# Patient Record
Sex: Male | Born: 1995 | Race: Black or African American | Hispanic: No | Marital: Single | Smoking: Current every day smoker
Health system: Southern US, Community
[De-identification: ages and names within clinical notes are randomized; demographics above are authoritative.]

## PROBLEM LIST (undated history)

## (undated) ENCOUNTER — Emergency Department (HOSPITAL_BASED_OUTPATIENT_CLINIC_OR_DEPARTMENT_OTHER)
Admission: EM | Payer: BC Managed Care – PPO | Source: Home / Self Care | Attending: Emergency Medicine | Admitting: Emergency Medicine

## (undated) DIAGNOSIS — F1210 Cannabis abuse, uncomplicated: Secondary | ICD-10-CM

## (undated) DIAGNOSIS — G2401 Drug induced subacute dyskinesia: Secondary | ICD-10-CM

## (undated) DIAGNOSIS — F909 Attention-deficit hyperactivity disorder, unspecified type: Secondary | ICD-10-CM

## (undated) DIAGNOSIS — M419 Scoliosis, unspecified: Secondary | ICD-10-CM

## (undated) DIAGNOSIS — R1116 Cannabis hyperemesis syndrome: Secondary | ICD-10-CM

## (undated) DIAGNOSIS — T7840XA Allergy, unspecified, initial encounter: Secondary | ICD-10-CM

## (undated) DIAGNOSIS — R112 Nausea with vomiting, unspecified: Secondary | ICD-10-CM

## (undated) DIAGNOSIS — I861 Scrotal varices: Secondary | ICD-10-CM

## (undated) DIAGNOSIS — L80 Vitiligo: Secondary | ICD-10-CM

## (undated) HISTORY — PX: NO PAST SURGERIES: SHX2092

## (undated) HISTORY — DX: Scrotal varices: I86.1

## (undated) HISTORY — DX: Scoliosis, unspecified: M41.9

## (undated) HISTORY — DX: Vitiligo: L80

## (undated) HISTORY — DX: Allergy, unspecified, initial encounter: T78.40XA

## (undated) HISTORY — DX: Attention-deficit hyperactivity disorder, unspecified type: F90.9

---
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
## 2017-04-17 ENCOUNTER — Ambulatory Visit: Payer: Self-pay | Admitting: Family Medicine

## 2017-04-18 ENCOUNTER — Encounter: Payer: Self-pay | Admitting: Family Medicine

## 2017-04-18 ENCOUNTER — Ambulatory Visit (INDEPENDENT_AMBULATORY_CARE_PROVIDER_SITE_OTHER): Payer: BC Managed Care – PPO | Admitting: Family Medicine

## 2017-04-18 VITALS — BP 128/60 | HR 83 | Temp 97.7°F | Resp 16 | Ht 68.0 in | Wt 141.0 lb

## 2017-04-18 DIAGNOSIS — F329 Major depressive disorder, single episode, unspecified: Secondary | ICD-10-CM | POA: Diagnosis not present

## 2017-04-18 DIAGNOSIS — F419 Anxiety disorder, unspecified: Secondary | ICD-10-CM | POA: Diagnosis not present

## 2017-04-18 DIAGNOSIS — R454 Irritability and anger: Secondary | ICD-10-CM

## 2017-04-18 MED ORDER — FLUOXETINE HCL 10 MG PO CAPS: 10.0000 mg | ORAL_CAPSULE | Freq: Every day | ORAL | 3 refills | Status: DC

## 2017-04-18 NOTE — Progress Notes (Signed)
Patient ID: Jonathan Davidson, male   DOB: 10/27/1995, 21 y.o.   MRN: [id number]     Subjective:  I acted as a Education administrator for Dr. Carollee Herter.  Guerry Bruin, [place]   Patient ID: Jonathan Davidson, male    DOB: 1996/04/27, 21 y.o.   MRN: [id number]  Chief Complaint  Patient presents with  . Anxiety    HPI  Patient is in today for anxiety.  He states that he thinks so much.  He denies being suicidal but states he does think about punching his boss.  He quit his job at [employer]  Patient Care Team: Carollee Herter, Alferd Apa, DO as PCP - General (Family Medicine)   Past Medical History:  Diagnosis Date  . Allergy   . Attention deficit hyperactivity disorder (ADHD) 06/14/2016  . Scoliosis   . Vitiligo 2012    Past Surgical History:  Procedure Laterality Date  . NO PAST SURGERIES      Family History  Problem Relation Age of Onset  . Hypertension Father   . Diabetes Unknown        GM  . Hypertension Unknown        F  . Diabetes Paternal Grandmother   . Coronary artery disease Neg Hx   . Cancer Neg Hx     Social History   Social History  . Marital status: Single    Spouse name: N/A  . Number of children: N/A  . Years of education: N/A   Occupational History  . Not on file.   Social History Main Topics  . Smoking status: Never Smoker  . Smokeless tobacco: Never Used  . Alcohol use No  . Drug use: No  . Sexual activity: Not on file   Other Topics Concern  . Not on file   Social History Narrative   Household: parents, 2 brothers and 1 sister (away in College)---Attends middle school, practice track &field, dance--    Outpatient Medications Prior to Visit  Medication Sig Dispense Refill  . VENTOLIN HFA 108 (90 Base) MCG/ACT inhaler INHALE 2 PUFFS EVERY 6 HRS AS NEEDED FOR WHEEZING/SHORTNESS OF BREATH. USE 2PUFFS 30MINS PRIOR EXERC 18 Inhaler 0  . lisdexamfetamine (VYVANSE) 20 MG capsule Take 1 capsule (20 mg total) by mouth daily. 30 capsule 0   No facility-administered medications prior to  visit.     No Known Allergies  Review of Systems  Constitutional: Negative for fever and malaise/fatigue.  HENT: Negative for congestion.   Eyes: Negative for blurred vision.  Respiratory: Negative for cough and shortness of breath.   Cardiovascular: Negative for chest pain, palpitations and leg swelling.  Gastrointestinal: Negative for vomiting.  Musculoskeletal: Negative for back pain.  Skin: Negative for rash.  Neurological: Negative for loss of consciousness and headaches.       Objective:    Physical Exam  Constitutional: He is oriented to person, place, and time. Vital signs are normal. He appears well-developed and well-nourished. He is sleeping.  HENT:  Head: Normocephalic and atraumatic.  Mouth/Throat: Oropharynx is clear and moist.  Eyes: EOM are normal. Pupils are equal, round, and reactive to light.  Neck: Normal range of motion. Neck supple. No thyromegaly present.  Cardiovascular: Normal rate and regular rhythm.   No murmur heard. Pulmonary/Chest: Effort normal and breath sounds normal. No respiratory distress. He has no wheezes. He has no rales. He exhibits no tenderness.  Musculoskeletal: He exhibits no edema or tenderness.  Neurological: He is alert and oriented to person, place, and time.  Skin: Skin is warm and dry.  Psychiatric: Judgment and thought content normal. His mood appears anxious. His affect is angry. His affect is not blunt, not labile and not inappropriate. His speech is not rapid and/or pressured, not delayed, not tangential and not slurred. He is agitated. He is not aggressive, not hyperactive, not slowed, not withdrawn, not actively hallucinating and not combative. Thought content is not paranoid and not delusional. Cognition and memory are normal. He exhibits a depressed mood. He expresses no homicidal and no suicidal ideation. He expresses no suicidal plans and no homicidal plans. He is communicative.  Pt states he quit his job a [employer] because he  could not stop thinking about hurting his boss He also had the same thoughts about his dad when they had an argument.  He states he would never kill anyone or himself but he realizes he has anger problem as well He is attentive.  Nursing note and vitals reviewed.   BP 128/60 (BP Location: Right Arm, Cuff Size: Normal)   Pulse 83   Temp 97.7 F (36.5 C) (Oral)   Resp 16   Ht '5\' 8"'  (1.727 m)   Wt 141 lb (64 kg)   SpO2 98%   BMI 21.44 kg/m  Wt Readings from Last 3 Encounters:  04/18/17 141 lb (64 kg) (28 %, Z= -0.58)*  08/02/16 140 lb (63.5 kg) (30 %, Z= -0.51)*  06/14/16 143 lb (64.9 kg) (36 %, Z= -0.35)*   * Growth percentiles are based on CDC 2-20 Years data.   BP Readings from Last 3 Encounters:  04/18/17 128/60  08/02/16 109/72  06/14/16 100/64     Immunization History  Administered Date(s) Administered  . DTaP 01/06/1998, 03/17/1998, 05/31/1998, 07/03/1999, 01/18/2002  . HPV 9-valent 04/15/2016, 05/21/2016  . Hepatitis A, Ped/Adol-2 Dose 04/15/2016  . Hepatitis B 26-Apr-1998, 01/06/1998, 09/20/1998  . HiB (PRP-OMP) 01/06/1998, 03/17/1998, 03/14/1999, 07/03/1999  . IPV 01/06/1998, 03/17/1998, 07/03/1999, 01/18/2002  . MMR 03/14/1999, 01/18/2002  . Meningococcal B, OMV 04/15/2016, 05/21/2016  . Meningococcal Conjugate 04/15/2016  . Pneumococcal Conjugate-13 07/03/1999  . Tdap 04/15/2016    Health Maintenance  Topic Date Due  . HIV Screening  10/22/2012  . INFLUENZA VACCINE  08/02/2017 (Originally 05/21/2017)  . TETANUS/TDAP  04/15/2026    Lab Results  Component Value Date   WBC 5.9 04/15/2016   HGB 14.6 04/15/2016   HCT 44.8 04/15/2016   PLT 224.0 04/15/2016   GLUCOSE 92 04/15/2016   CHOL 124 04/15/2016   TRIG 88.0 04/15/2016   HDL 51.60 04/15/2016   LDLCALC 55 04/15/2016   ALT 17 04/15/2016   AST 24 04/15/2016   NA 139 04/15/2016   K 4.1 04/15/2016   CL 105 04/15/2016   CREATININE 1.06 04/15/2016   BUN 9 04/15/2016   CO2 29 04/15/2016    No results  found for: TSH Lab Results  Component Value Date   WBC 5.9 04/15/2016   HGB 14.6 04/15/2016   HCT 44.8 04/15/2016   MCV 83.2 04/15/2016   PLT 224.0 04/15/2016   Lab Results  Component Value Date   NA 139 04/15/2016   K 4.1 04/15/2016   CO2 29 04/15/2016   GLUCOSE 92 04/15/2016   BUN 9 04/15/2016   CREATININE 1.06 04/15/2016   BILITOT 0.4 04/15/2016   ALKPHOS 45 (L) 04/15/2016   AST 24 04/15/2016   ALT 17 04/15/2016   PROT 7.4 04/15/2016   ALBUMIN 4.7 04/15/2016   CALCIUM 9.6 04/15/2016   GFR 116.40 04/15/2016  Lab Results  Component Value Date   CHOL 124 04/15/2016   Lab Results  Component Value Date   HDL 51.60 04/15/2016   Lab Results  Component Value Date   LDLCALC 55 04/15/2016   Lab Results  Component Value Date   TRIG 88.0 04/15/2016   Lab Results  Component Value Date   CHOLHDL 2 04/15/2016   No results found for: HGBA1C       Assessment & Plan:   Problem List Items Addressed This Visit    None    Visit Diagnoses    Excessive anger    -  Primary   Relevant Medications   FLUoxetine (PROZAC) 10 MG capsule   Other Relevant Orders   Ambulatory referral to Psychology   Anxiety and depression       Relevant Orders   Ambulatory referral to Psychology    pt agrees to see counselor and take meds rto 1 month or sooner prn Pt agrees to call if he thinks about hurting himself or someone else  I have discontinued Mr. Mechling lisdexamfetamine. I am also having him start on FLUoxetine. Additionally, I am having him maintain his VENTOLIN HFA.  Meds ordered this encounter  Medications  . FLUoxetine (PROZAC) 10 MG capsule    Sig: Take 1 capsule (10 mg total) by mouth daily.    Dispense:  30 capsule    Refill:  3    CMA served as scribe during this visit. History, Physical and Plan performed by medical provider. Documentation and orders reviewed and attested to.  Ann Held, DO

## 2017-04-18 NOTE — Patient Instructions (Signed)
Tips for Managing Your Anger HOW CAN ANGER AFFECT MY LIFE? Everyone feels angry from time to time. It is okay and normal to feel angry. However, the way that you behave or react to anger can make it a problem. If you react too strongly to anger or you cannot control your anger, that can cause relationships problems at home and work. Anger can also affect your health. Uncontrolled anger increases your risk of heart disease. When you are angry, your heart rate and blood pressure rise. Levels of certain energy hormones, such as adrenaline, also increase. When this happens, your heart has to work harder. In extreme cases, anger can cause the blood vessels to become narrow. This reduces the supply of blood and oxygen to the heart, and that can trigger chest pain (angina). Anger can also trigger stress-related problems, such as:  Headaches.  Poor digestion.  Trouble sleeping (insomnia).  WHAT ACTIONS CAN I TAKE TO HELP MANAGE MY ANGER? You can take actions to help you manage your anger. For example:  Express your anger. When you express your anger in a healthy way, it is a form of communication. The following strategies can help you to express your anger in a healthy way and when you are ready to do so: ? Step away. When you are feeling reactive, it may take at least 20 minutes for your body to return to its normal blood pressure and heart rate. To help your body do this, take a walk, listen to music, stretch, take deep breaths, and avoid the situation or person who is making you angry. Try to only discuss your anger when you feel calm again. ? Try to consider how others feel before you react. Avoid swearing, sighing, raising your voice, or blaming. ? Choose a good time to work through problems. You may be more likely to lose your temper at the end of the day when you are tired. ? Keep an anger journal. Writing down the situations that make you angry can help you figure out what triggers your anger and  why.  Consider changing your perception. Is there another way you can view the situation that will leave you with a different emotion? Sometimes, changing the way you think about a situation can make it seem less infuriating. Here are some ways to do that: ? Remind yourself that everyone is not out to get you. ? Remind yourself that a disappointing result is not the end of the world. ? Take steps to solve or prevent the situation that upsets you. ? Find the humor in an aggravating situation. ? Deal with the physical effects by taking deep breaths, exercising, or taking a walk. ? Slowly repeat the word "relax" or another calming phrase. ? Picture a relaxing image in your mind. Close your eyes and use that image to help you calm yourself.  WHEN SHOULD I SEEK ADDITIONAL HELP? Anger becomes a problem if it occurs frequently and lasts for long periods of time. You may also need help managing your anger if:  You use physical force or aggression when you are angry and others feel threatened and fearful.  You feel that your anger is out of control.  Anger is interfering with your job.  Anger is causing problems with your health.  Anger is causing problems with your relationships.  Anger is affecting your ability to tolerate normal daily situations, such as sitting in a traffic jam or waiting in line.  You treat others disrespectfully.  You do   not trust people around you.  It may help to ask someone you trust whether he or she thinks you show any of these signs. Sometimes, it can be hard to recognize the problem yourself. WHERE CAN I GET SUPPORT? A psychologist or another licensed mental health professional can help you learn how to manage your anger. Ask your health care provider for a referral, or look online to find a psychologist who specializes in anger management. You can search the websites of many mental health organizations to find a mental health care provider. Local Domestic Abuse  Projects are also available for help. Your local hospital or behavioral counselors in your area may also offer anger management programs or support groups that can help. WHERE CAN I FIND MORE INFORMATION?  The U.S. Centers for Disease Control and Prevention: www.cdc.gov/bam/life/getting-along3.html  American Psychological Association: www.apa.org/topics/anger/  The Substance Abuse and Mental Health Services Administration: http://www.samhsa.gov/samhsaNewsLetter/Volume_22_Number_3/working_with_anger/  National Resource Center on Domestic Violence: http://www.nrcdv.org/dvam/  This information is not intended to replace advice given to you by your health care provider. Make sure you discuss any questions you have with your health care provider. Document Released: 08/04/2007 Document Revised: 06/09/2016 Document Reviewed: 08/11/2015 Elsevier Interactive Patient Education  2018 Elsevier Inc.  

## 2017-09-09 ENCOUNTER — Ambulatory Visit (INDEPENDENT_AMBULATORY_CARE_PROVIDER_SITE_OTHER): Payer: BC Managed Care – PPO | Admitting: Family Medicine

## 2017-09-09 ENCOUNTER — Encounter: Payer: Self-pay | Admitting: Family Medicine

## 2017-09-09 ENCOUNTER — Ambulatory Visit (HOSPITAL_BASED_OUTPATIENT_CLINIC_OR_DEPARTMENT_OTHER)
Admission: RE | Admit: 2017-09-09 | Discharge: 2017-09-09 | Disposition: A | Discharge status: Home / Self Care | Payer: BC Managed Care – PPO | Source: Ambulatory Visit | Attending: Family Medicine | Admitting: Family Medicine

## 2017-09-09 VITALS — BP 106/60 | HR 120 | Temp 98.4°F | Resp 16 | Ht 68.0 in | Wt 138.2 lb

## 2017-09-09 DIAGNOSIS — R059 Cough, unspecified: Secondary | ICD-10-CM | POA: Insufficient documentation

## 2017-09-09 DIAGNOSIS — R591 Generalized enlarged lymph nodes: Secondary | ICD-10-CM | POA: Diagnosis not present

## 2017-09-09 DIAGNOSIS — R05 Cough: Secondary | ICD-10-CM

## 2017-09-09 DIAGNOSIS — F17290 Nicotine dependence, other tobacco product, uncomplicated: Secondary | ICD-10-CM | POA: Insufficient documentation

## 2017-09-09 MED ORDER — CEPHALEXIN 500 MG PO CAPS: 500.0000 mg | ORAL_CAPSULE | Freq: Two times a day (BID) | ORAL | 0 refills | Status: DC

## 2017-09-09 NOTE — Assessment & Plan Note (Signed)
Counseled pt on quitting He will check out [email or web address] for coupon for lozenges or patches

## 2017-09-09 NOTE — Progress Notes (Signed)
Patient ID: Chuck Caban, male   DOB: 1995-10-23, 21 y.o.   MRN: [id number]     Subjective:  I acted as a Education administrator for Dr. Carollee Herter.  Guerry Bruin, [place]   Patient ID: Wilbur Oakland, male    DOB: 1996/05/27, 21 y.o.   MRN: [id number]  Chief Complaint  Patient presents with  . lump on back of right side of neck    HPI  Patient is in today for lump on right back side of neck.  He noticed last night.  Has has been sick recently.  No fevers and symptoms resolved.  Lump is not tender.     Patient Care Team: Carollee Herter, Alferd Apa, DO as PCP - General (Family Medicine)   Past Medical History:  Diagnosis Date  . Allergy   . Attention deficit hyperactivity disorder (ADHD) 06/14/2016  . Scoliosis   . Vitiligo 2012    Past Surgical History:  Procedure Laterality Date  . NO PAST SURGERIES      Family History  Problem Relation Age of Onset  . Hypertension Father   . Diabetes Unknown        GM  . Hypertension Unknown        F  . Diabetes Paternal Grandmother   . Coronary artery disease Neg Hx   . Cancer Neg Hx     Social History   Socioeconomic History  . Marital status: Single    Spouse name: Not on file  . Number of children: Not on file  . Years of education: Not on file  . Highest education level: Not on file  Social Needs  . Financial resource strain: Not on file  . Food insecurity - worry: Not on file  . Food insecurity - inability: Not on file  . Transportation needs - medical: Not on file  . Transportation needs - non-medical: Not on file  Occupational History  . Not on file  Tobacco Use  . Smoking status: Current Every Day Smoker    Types: Cigars    Start date: 09/10/2015  . Smokeless tobacco: Never Used  . Tobacco comment: 3-4 black and mild cigars   Substance and Sexual Activity  . Alcohol use: No    Alcohol/week: 0.0 oz  . Drug use: No  . Sexual activity: Not on file  Other Topics Concern  . Not on file  Social History Narrative   Household: parents, 2  brothers and 1 sister (away in College)---Attends middle school, practice track &field, dance--    Outpatient Medications Prior to Visit  Medication Sig Dispense Refill  . FLUoxetine (PROZAC) 10 MG capsule Take 1 capsule (10 mg total) by mouth daily. 30 capsule 3  . VENTOLIN HFA 108 (90 Base) MCG/ACT inhaler INHALE 2 PUFFS EVERY 6 HRS AS NEEDED FOR WHEEZING/SHORTNESS OF BREATH. USE 2PUFFS 30MINS PRIOR EXERC 18 Inhaler 0   No facility-administered medications prior to visit.     No Known Allergies  Review of Systems  Constitutional: Negative for fever and malaise/fatigue.  HENT: Negative for congestion.   Eyes: Negative for blurred vision.  Respiratory: Negative for cough and shortness of breath.   Cardiovascular: Negative for chest pain, palpitations and leg swelling.  Gastrointestinal: Negative for vomiting.  Musculoskeletal: Negative for back pain.  Skin: Negative for rash.       Lump on back right side of neck   Neurological: Negative for loss of consciousness and headaches.       Objective:    Physical Exam  Constitutional:  He is oriented to person, place, and time. Vital signs are normal. He appears well-developed and well-nourished. He is sleeping.  HENT:  Head: Normocephalic and atraumatic.  Mouth/Throat: Oropharynx is clear and moist.  Eyes: EOM are normal. Pupils are equal, round, and reactive to light.  Neck: Normal range of motion. Neck supple. No thyromegaly present.  Cardiovascular: Normal rate and regular rhythm.  No murmur heard. Pulmonary/Chest: Effort normal. No respiratory distress. He has decreased breath sounds. He has no wheezes. He has no rales. He exhibits no tenderness.  Musculoskeletal: He exhibits no edema or tenderness.  Lymphadenopathy:    He has cervical adenopathy.       Right cervical: Superficial cervical adenopathy present.       Left cervical: No superficial cervical, no deep cervical and no posterior cervical adenopathy present.    Neurological: He is alert and oriented to person, place, and time.  Skin: Skin is warm and dry.  Psychiatric: He has a normal mood and affect. His behavior is normal. Judgment and thought content normal.  Nursing note and vitals reviewed.   BP 106/60 (BP Location: Right Arm, Cuff Size: Normal)   Pulse (!) 120   Temp 98.4 F (36.9 C) (Oral)   Resp 16   Ht '5\' 8"'  (1.727 m)   Wt 138 lb 3.2 oz (62.7 kg)   SpO2 96%   BMI 21.01 kg/m  Wt Readings from Last 3 Encounters:  09/09/17 138 lb 3.2 oz (62.7 kg) (22 %, Z= -0.77)*  04/18/17 141 lb (64 kg) (28 %, Z= -0.58)*  08/02/16 140 lb (63.5 kg) (30 %, Z= -0.51)*   * Growth percentiles are based on CDC (Boys, 2-20 Years) data.   BP Readings from Last 3 Encounters:  09/09/17 106/60 (7 %, Z = -1.49 /  13 %, Z = -1.12)*  04/18/17 128/60 (75 %, Z = 0.66 /  14 %, Z = -1.09)*  08/02/16 109/72 (16 %, Z = -1.01 /  61 %, Z = 0.29)*   *BP percentiles are based on the August 2017 AAP Clinical Practice Guideline for boys     Immunization History  Administered Date(s) Administered  . DTaP 01/06/1998, 03/17/1998, 05/31/1998, 07/03/1999, 01/18/2002  . HPV 9-valent 04/15/2016, 05/21/2016  . Hepatitis A, Ped/Adol-2 Dose 04/15/2016  . Hepatitis B 1996-08-19, 01/06/1998, 09/20/1998  . HiB (PRP-OMP) 01/06/1998, 03/17/1998, 03/14/1999, 07/03/1999  . IPV 01/06/1998, 03/17/1998, 07/03/1999, 01/18/2002  . MMR 03/14/1999, 01/18/2002  . Meningococcal B, OMV 04/15/2016, 05/21/2016  . Meningococcal Conjugate 04/15/2016  . Pneumococcal Conjugate-13 07/03/1999  . Tdap 04/15/2016    Health Maintenance  Topic Date Due  . HIV Screening  10/22/2012  . INFLUENZA VACCINE  05/21/2017  . TETANUS/TDAP  04/15/2026    Lab Results  Component Value Date   WBC 5.9 04/15/2016   HGB 14.6 04/15/2016   HCT 44.8 04/15/2016   PLT 224.0 04/15/2016   GLUCOSE 92 04/15/2016   CHOL 124 04/15/2016   TRIG 88.0 04/15/2016   HDL 51.60 04/15/2016   LDLCALC 55 04/15/2016    ALT 17 04/15/2016   AST 24 04/15/2016   NA 139 04/15/2016   K 4.1 04/15/2016   CL 105 04/15/2016   CREATININE 1.06 04/15/2016   BUN 9 04/15/2016   CO2 29 04/15/2016    No results found for: TSH Lab Results  Component Value Date   WBC 5.9 04/15/2016   HGB 14.6 04/15/2016   HCT 44.8 04/15/2016   MCV 83.2 04/15/2016   PLT 224.0 04/15/2016  Lab Results  Component Value Date   NA 139 04/15/2016   K 4.1 04/15/2016   CO2 29 04/15/2016   GLUCOSE 92 04/15/2016   BUN 9 04/15/2016   CREATININE 1.06 04/15/2016   BILITOT 0.4 04/15/2016   ALKPHOS 45 (L) 04/15/2016   AST 24 04/15/2016   ALT 17 04/15/2016   PROT 7.4 04/15/2016   ALBUMIN 4.7 04/15/2016   CALCIUM 9.6 04/15/2016   GFR 116.40 04/15/2016   Lab Results  Component Value Date   CHOL 124 04/15/2016   Lab Results  Component Value Date   HDL 51.60 04/15/2016   Lab Results  Component Value Date   LDLCALC 55 04/15/2016   Lab Results  Component Value Date   TRIG 88.0 04/15/2016   Lab Results  Component Value Date   CHOLHDL 2 04/15/2016   No results found for: HGBA1C       Assessment & Plan:   Problem List Items Addressed This Visit      Unprioritized   Cigar smoker unmotivated to quit    Counseled pt on quitting He will check out [email or web address] for coupon for lozenges or patches        Cough    Mucinex, delsym Probably secondary to cigar smoking  Consider xray       Relevant Orders   DG Chest 2 View   Lymphadenopathy - Primary    abx per orders Check cbc rto if it does not resolve      Relevant Orders   CBC with Differential/Platelet      I have discontinued Shea Stakes Dardis's VENTOLIN HFA and FLUoxetine. I am also having him start on cephALEXin.  Meds ordered this encounter  Medications  . cephALEXin (KEFLEX) 500 MG capsule    Sig: Take 1 capsule (500 mg total) by mouth 2 (two) times daily.    Dispense:  20 capsule    Refill:  0    CMA served as scribe during this visit. History,  Physical and Plan performed by medical provider. Documentation and orders reviewed and attested to.   Ann Held, DO

## 2017-09-09 NOTE — Assessment & Plan Note (Signed)
Mucinex, delsym Probably secondary to cigar smoking  Consider xray

## 2017-09-09 NOTE — Assessment & Plan Note (Signed)
abx per orders Check cbc rto if it does not resolve

## 2017-09-09 NOTE — Patient Instructions (Addendum)
Lymphadenopathy Lymphadenopathy refers to swollen or enlarged lymph glands, also called lymph nodes. Lymph glands are part of your body's defense (immune) system, which protects the body from infections, germs, and diseases. Lymph glands are found in many locations in your body, including the neck, underarm, and groin. Many things can cause lymph glands to become enlarged. When your immune system responds to germs, such as viruses or bacteria, infection-fighting cells and fluid build up. This causes the glands to grow in size. Usually, this is not something to worry about. The swelling and any soreness often go away without treatment. However, swollen lymph glands can also be caused by a number of diseases. Your health care provider may do various tests to help determine the cause. If the cause of your swollen lymph glands cannot be found, it is important to monitor your condition to make sure the swelling goes away. Follow these instructions at home: Watch your condition for any changes. The following actions may help to lessen any discomfort you are feeling:  Get plenty of rest.  Take medicines only as directed by your health care provider. Your health care provider may recommend over-the-counter medicines for pain.  Apply moist heat compresses to the site of swollen lymph nodes as directed by your health care provider. This can help reduce any pain.  Check your lymph nodes daily for any changes.  Keep all follow-up visits as directed by your health care provider. This is important.  Contact a health care provider if:  Your lymph nodes are still swollen after 2 weeks.  Your swelling increases or spreads to other areas.  Your lymph nodes are hard, seem fixed to the skin, or are growing rapidly.  Your skin over the lymph nodes is red and inflamed.  You have a fever.  You have chills.  You have fatigue.  You develop a sore throat.  You have abdominal pain.  You have weight  loss.  You have night sweats. Get help right away if:  You notice fluid leaking from the area of the enlarged lymph node.  You have severe pain in any area of your body.  You have chest pain.  You have shortness of breath. This information is not intended to replace advice given to you by your health care provider. Make sure you discuss any questions you have with your health care provider. Document Released: 07/16/2008 Document Revised: 03/14/2016 Document Reviewed: 05/12/2014 Elsevier Interactive Patient Education  2018 [employer]   Tobacco Use Disorder Tobacco use disorder (TUD) is a mental disorder. It is the long-term use of tobacco in spite of related health problems or difficulty with normal life activities. Tobacco is most commonly smoked as cigarettes and less commonly as cigars or pipes. Smokeless chewing tobacco and snuff are also popular. People with TUD get a feeling of extreme pleasure (euphoria) from using tobacco and have a desire to use it again and again. Repeated use of tobacco can cause problems. The addictive effects of tobacco are due mainly tothe ingredient nicotine. Nicotine also causes a rush of adrenaline (epinephrine) in the body. This leads to increased blood pressure, heart rate, and breathing rate. These changes may cause problems for people with high blood pressure, weak hearts, or lung disease. High doses of nicotine in children and pets can lead to seizures and death. Tobacco contains a number of other unsafe chemicals. These chemicals are especially harmful when inhaled as smoke and can damage almost every organ in the body. Smokers live shorter lives  than nonsmokers and are at risk of dying from a number of diseases and cancers. Tobacco smoke can also cause health problems for nonsmokers (due to inhaling secondhand smoke). Smoking is also a fire hazard. TUD usually starts in the late teenage years and is most common in young adults between the ages of 718  and 25 years. People who start smoking earlier in life are more likely to continue smoking as adults. TUD is somewhat more common in men than women. People with TUD are at higher risk for using alcohol and other drugs of abuse. What increases the risk? Risk factors for TUD include:  Having family members with the disorder.  Being around people who use tobacco.  Having an existing mental health issue such as schizophrenia, depression, bipolar disorder, ADHD, or posttraumatic stress disorder (PTSD).  What are the signs or symptoms? People with tobacco use disorder have two or more of the following signs and symptoms within 12 months:  Use of more tobacco over a longer period than intended.  Not able to cut down or control tobacco use.  A lot of time spent obtaining or using tobacco.  Strong desire or urge to use tobacco (craving). Cravings may last for 6 months or longer after quitting.  Use of tobacco even when use leads to major problems at work, school, or home.  Use of tobacco even when use leads to relationship problems.  Giving up or cutting down on important life activities because of tobacco use.  Repeatedly using tobacco in situations where it puts you or others in physical danger, like smoking in bed.  Use of tobacco even when it is known that a physical or mental problem is likely related to tobacco use. ? Physical problems are numerous and may include chronic bronchitis, emphysema, lung and other cancers, gum disease, high blood pressure, heart disease, and stroke. ? Mental problems caused by tobacco may include difficulty sleeping and anxiety.  Need to use greater amounts of tobacco to get the same effect. This means you have developed a tolerance.  Withdrawal symptoms as a result of stopping or rapidly cutting back use. These symptoms may last a month or more after quitting and include the following: ? Depressed, anxious, or irritable mood. ? Difficulty  concentrating. ? Increased appetite. ? Restlessness or trouble sleeping. ? Use of tobacco to avoid withdrawal symptoms.  How is this diagnosed? Tobacco use disorder is diagnosed by your health care provider. A diagnosis may be made by:  Your health care provider asking questions about your tobacco use and any problems it may be causing.  A physical exam.  Lab tests.  You may be referred to a mental health professional or addiction specialist.  The severity of tobacco use disorder depends on the number of signs and symptoms you have:  Mild-Two or three symptoms.  Moderate-Four or five symptoms.  Severe-Six or more symptoms.  How is this treated? Many people with tobacco use disorder are unable to quit on their own and need help. Treatment options include the following:  Nicotine replacement therapy (NRT). NRT provides nicotine without the other harmful chemicals in tobacco. NRT gradually lowers the dosage of nicotine in the body and reduces withdrawal symptoms. NRT is available in over-the-counter forms (gum, lozenges, and skin patches) as well as prescription forms (mouth inhaler and nasal spray).  Medicines.This may include: ? Antidepressant medicine that may reduce nicotine cravings. ? A medicine that acts on nicotine receptors in the brain to reduce cravings and withdrawal  symptoms. It may also block the effects of tobacco in people with TUD who relapse.  Counseling or talk therapy. A form of talk therapy called behavioral therapy is commonly used to treat people with TUD. Behavioral therapy looks at triggers for tobacco use, how to avoid them, and how to cope with cravings. It is most effective in person or by phone but is also available in self-help forms (books and Internet websites).  Support groups. These provide emotional support, advice, and guidance for quitting tobacco.  The most effective treatment for TUD is usually a combination of medicine, talk therapy, and  support groups. Follow these instructions at home:  Keep all follow-up visits as directed by your health care provider. This is important.  Take medicines only as directed by your health care provider.  Check with your health care provider before starting new prescription or over-the-counter medicines. Contact a health care provider if:  You are not able to take your medicines as prescribed.  Treatment is not helping your TUD and your symptoms get worse. Get help right away if:  You have serious thoughts about hurting yourself or others.  You have trouble breathing, chest pain, sudden weakness, or sudden numbness in part of your body. This information is not intended to replace advice given to you by your health care provider. Make sure you discuss any questions you have with your health care provider. Document Released: 06/12/2004 Document Revised: 06/09/2016 Document Reviewed: 12/03/2013 Elsevier Interactive Patient Education  [name].

## 2017-09-10 LAB — CBC WITH DIFFERENTIAL/PLATELET
BASOS ABS: 0 10*3/uL (ref 0.0–0.1)
Basophils Relative: 0.8 % (ref 0.0–3.0)
Eosinophils Absolute: 0 10*3/uL (ref 0.0–0.7)
Eosinophils Relative: 0.4 % (ref 0.0–5.0)
HCT: 47.3 % (ref 36.0–49.0)
Hemoglobin: 15.6 g/dL (ref 12.0–16.0)
LYMPHS ABS: 1.4 10*3/uL (ref 0.7–4.0)
Lymphocytes Relative: 28.6 % (ref 24.0–48.0)
MCHC: 33.1 g/dL (ref 31.0–37.0)
MCV: 86.1 fl (ref 78.0–98.0)
MONOS PCT: 8.4 % (ref 3.0–12.0)
Monocytes Absolute: 0.4 10*3/uL (ref 0.1–1.0)
Neutro Abs: 3.1 10*3/uL (ref 1.4–7.7)
Neutrophils Relative %: 61.8 % (ref 43.0–71.0)
Platelets: 226 10*3/uL (ref 150.0–575.0)
RBC: 5.49 Mil/uL (ref 3.80–5.70)
RDW: 13.6 % (ref 11.4–15.5)
WBC: 5 10*3/uL (ref 4.5–13.5)

## 2017-09-16 ENCOUNTER — Telehealth: Payer: Self-pay | Admitting: Family Medicine

## 2017-09-16 DIAGNOSIS — R221 Localized swelling, mass and lump, neck: Secondary | ICD-10-CM

## 2017-09-16 DIAGNOSIS — R591 Generalized enlarged lymph nodes: Secondary | ICD-10-CM

## 2017-09-16 NOTE — Telephone Encounter (Signed)
Results given Per pt the lump is still present  Notes recorded by Donato SchultzLowne Chase, Yvonne R, DO on 09/12/2017 at 6:07 PM EST Normal labs--- how is the lump?  If stil there--- CT soft tissue neck

## 2017-09-17 NOTE — Telephone Encounter (Signed)
Do you want with or without contrast

## 2017-09-18 NOTE — Telephone Encounter (Signed)
Ct ordered

## 2017-09-18 NOTE — Addendum Note (Signed)
Addended by: Thelma BargeICHARDSON, Danielle Lento D on: 09/18/2017 09:54 AM   Modules accepted: Orders

## 2017-09-18 NOTE — Telephone Encounter (Signed)
I believe it is with

## 2018-04-16 ENCOUNTER — Encounter: Payer: Self-pay | Admitting: Family

## 2018-04-16 ENCOUNTER — Ambulatory Visit (INDEPENDENT_AMBULATORY_CARE_PROVIDER_SITE_OTHER): Payer: BC Managed Care – PPO | Admitting: Family

## 2018-04-16 VITALS — BP 133/68 | HR 120 | Temp 98.7°F | Resp 16 | Ht 68.0 in | Wt 148.4 lb

## 2018-04-16 DIAGNOSIS — N509 Disorder of male genital organs, unspecified: Secondary | ICD-10-CM

## 2018-04-16 NOTE — Patient Instructions (Signed)
Please schedule ultrasound on the first floor. We will contact you with your results.

## 2018-04-16 NOTE — Progress Notes (Signed)
Subjective:    Patient ID: Jonathan Davidson, male    DOB: 08-09-1996, 22 y.o.   MRN: [id number]  HPI  Pt is a 22 yr old male who presents with c/o abnormal shaped testicle. Reports that he first noticed this 3 days ago. Denies pain, penile discharge or dysuria.  Review of Systems See HPI  Past Medical History:  Diagnosis Date  . Allergy   . Attention deficit hyperactivity disorder (ADHD) 06/14/2016  . Scoliosis   . Vitiligo 2012     Social History   Socioeconomic History  . Marital status: Single    Spouse name: Not on file  . Number of children: Not on file  . Years of education: Not on file  . Highest education level: Not on file  Occupational History  . Not on file  Social Needs  . Financial resource strain: Not on file  . Food insecurity:    Worry: Not on file    Inability: Not on file  . Transportation needs:    Medical: Not on file    Non-medical: Not on file  Tobacco Use  . Smoking status: Current Every Day Smoker    Types: Cigars    Start date: 09/10/2015  . Smokeless tobacco: Never Used  . Tobacco comment: 3-4 black and mild cigars   Substance and Sexual Activity  . Alcohol use: No    Alcohol/week: 0.0 oz  . Drug use: No  . Sexual activity: Not on file  Lifestyle  . Physical activity:    Days per week: Not on file    Minutes per session: Not on file  . Stress: Not on file  Relationships  . Social connections:    Talks on phone: Not on file    Gets together: Not on file    Attends religious service: Not on file    Active member of club or organization: Not on file    Attends meetings of clubs or organizations: Not on file    Relationship status: Not on file  . Intimate partner violence:    Fear of current or ex partner: Not on file    Emotionally abused: Not on file    Physically abused: Not on file    Forced sexual activity: Not on file  Other Topics Concern  . Not on file  Social History Narrative   Household: parents, 2 brothers and 1 sister  (away in College)---Attends middle school, practice track &field, dance--    Past Surgical History:  Procedure Laterality Date  . NO PAST SURGERIES      Family History  Problem Relation Age of Onset  . Hypertension Father   . Diabetes Unknown        GM  . Hypertension Unknown        F  . Diabetes Paternal Grandmother   . Coronary artery disease Neg Hx   . Cancer Neg Hx     No Known Allergies  No current outpatient medications on file prior to visit.   No current facility-administered medications on file prior to visit.     BP 133/68 (BP Location: Right Arm, Patient Position: Sitting, Cuff Size: Small)   Pulse (!) 120   Temp 98.7 F (37.1 C) (Oral)   Resp 16   Ht 5\' 8"  (1.727 m)   Wt 148 lb 6.4 oz (67.3 kg)   SpO2 100%   BMI 22.56 kg/m       Objective:   Physical Exam  Constitutional: He is oriented  to person, place, and time. He appears well-developed and well-nourished. No distress.  HENT:  Head: Normocephalic and atraumatic.  Cardiovascular: Normal rate and regular rhythm.  No murmur heard. Pulmonary/Chest: Effort normal and breath sounds normal. No respiratory distress. He has no wheezes. He has no rales.  Genitourinary:  Genitourinary Comments: ?prominent epididymis  Both testes normal in size.   Musculoskeletal: He exhibits no edema.  Neurological: He is alert and oriented to person, place, and time.  Skin: Skin is warm and dry.  Psychiatric: He has a normal mood and affect. His behavior is normal. Thought content normal.          Assessment & Plan:  Testicular abnormality- will order scrotal ultrasound for further evaluation.

## 2018-04-18 ENCOUNTER — Encounter: Payer: Self-pay | Admitting: Family

## 2018-04-18 ENCOUNTER — Ambulatory Visit (HOSPITAL_BASED_OUTPATIENT_CLINIC_OR_DEPARTMENT_OTHER)
Admission: RE | Admit: 2018-04-18 | Discharge: 2018-04-18 | Disposition: A | Discharge status: Home / Self Care | Payer: BC Managed Care – PPO | Source: Ambulatory Visit | Attending: Family | Admitting: Family

## 2018-04-18 ENCOUNTER — Telehealth: Payer: Self-pay | Admitting: Family

## 2018-04-18 DIAGNOSIS — I861 Scrotal varices: Secondary | ICD-10-CM | POA: Insufficient documentation

## 2018-04-18 DIAGNOSIS — N509 Disorder of male genital organs, unspecified: Secondary | ICD-10-CM

## 2018-04-18 HISTORY — DX: Scrotal varices: I86.1

## 2018-04-18 NOTE — Telephone Encounter (Signed)
Please let pt know that US shows a varicocele, Like varicose veins in the scrotum.  This is a common finding and not dangerous. I would like him to see urology though. Referral has been placed.

## 2018-04-20 NOTE — Telephone Encounter (Signed)
Results given to patient, he understands and will wait for referral.

## 2018-09-28 ENCOUNTER — Encounter (HOSPITAL_BASED_OUTPATIENT_CLINIC_OR_DEPARTMENT_OTHER): Payer: Self-pay

## 2018-09-28 ENCOUNTER — Other Ambulatory Visit (HOSPITAL_COMMUNITY)
Admission: RE | Admit: 2018-09-28 | Discharge: 2018-09-28 | Disposition: A | Discharge status: Home / Self Care | Payer: BC Managed Care – PPO | Source: Ambulatory Visit | Attending: Family Medicine | Admitting: Family Medicine

## 2018-09-28 ENCOUNTER — Ambulatory Visit (HOSPITAL_BASED_OUTPATIENT_CLINIC_OR_DEPARTMENT_OTHER)
Admission: RE | Admit: 2018-09-28 | Discharge: 2018-09-28 | Disposition: A | Discharge status: Home / Self Care | Payer: BC Managed Care – PPO | Source: Ambulatory Visit | Attending: Family Medicine | Admitting: Family Medicine

## 2018-09-28 ENCOUNTER — Ambulatory Visit (INDEPENDENT_AMBULATORY_CARE_PROVIDER_SITE_OTHER): Payer: BC Managed Care – PPO | Admitting: Family Medicine

## 2018-09-28 ENCOUNTER — Encounter: Payer: Self-pay | Admitting: Family Medicine

## 2018-09-28 VITALS — BP 98/60 | HR 65 | Temp 98.3°F | Resp 16 | Ht 68.0 in | Wt 146.4 lb

## 2018-09-28 DIAGNOSIS — J069 Acute upper respiratory infection, unspecified: Secondary | ICD-10-CM | POA: Diagnosis not present

## 2018-09-28 DIAGNOSIS — R221 Localized swelling, mass and lump, neck: Secondary | ICD-10-CM | POA: Diagnosis not present

## 2018-09-28 DIAGNOSIS — Z7251 High risk heterosexual behavior: Secondary | ICD-10-CM

## 2018-09-28 LAB — CBC WITH DIFFERENTIAL/PLATELET
BASOS PCT: 0.7 % (ref 0.0–3.0)
Basophils Absolute: 0 10*3/uL (ref 0.0–0.1)
EOS PCT: 1.1 % (ref 0.0–5.0)
Eosinophils Absolute: 0.1 10*3/uL (ref 0.0–0.7)
HCT: 48.3 % (ref 39.0–52.0)
Hemoglobin: 16.1 g/dL (ref 13.0–17.0)
LYMPHS ABS: 1.8 10*3/uL (ref 0.7–4.0)
Lymphocytes Relative: 31.1 % (ref 12.0–46.0)
MCHC: 33.3 g/dL (ref 30.0–36.0)
MCV: 85 fl (ref 78.0–100.0)
MONO ABS: 0.5 10*3/uL (ref 0.1–1.0)
Monocytes Relative: 9.4 % (ref 3.0–12.0)
NEUTROS PCT: 57.7 % (ref 43.0–77.0)
Neutro Abs: 3.4 10*3/uL (ref 1.4–7.7)
PLATELETS: 239 10*3/uL (ref 150.0–400.0)
RBC: 5.69 Mil/uL (ref 4.22–5.81)
RDW: 14 % (ref 11.5–14.6)
WBC: 5.8 10*3/uL (ref 4.5–10.5)

## 2018-09-28 MED ORDER — LORATADINE 10 MG PO TABS: 10.0000 mg | ORAL_TABLET | Freq: Every day | ORAL | 11 refills | Status: DC

## 2018-09-28 MED ORDER — IOPAMIDOL (ISOVUE-300) INJECTION 61%
100.0000 mL | Freq: Once | INTRAVENOUS | Status: AC | PRN
  Administered 2018-09-28: 75 mL via INTRAVENOUS

## 2018-09-28 NOTE — Patient Instructions (Signed)
Lymphadenopathy Lymphadenopathy refers to swollen or enlarged lymph glands, also called lymph nodes. Lymph glands are part of your body's defense (immune) system, which protects the body from infections, germs, and diseases. Lymph glands are found in many locations in your body, including the neck, underarm, and groin. Many things can cause lymph glands to become enlarged. When your immune system responds to germs, such as viruses or bacteria, infection-fighting cells and fluid build up. This causes the glands to grow in size. Usually, this is not something to worry about. The swelling and any soreness often go away without treatment. However, swollen lymph glands can also be caused by a number of diseases. Your health care provider may do various tests to help determine the cause. If the cause of your swollen lymph glands cannot be found, it is important to monitor your condition to make sure the swelling goes away. Follow these instructions at home: Watch your condition for any changes. The following actions may help to lessen any discomfort you are feeling:  Get plenty of rest.  Take medicines only as directed by your health care provider. Your health care provider may recommend over-the-counter medicines for pain.  Apply moist heat compresses to the site of swollen lymph nodes as directed by your health care provider. This can help reduce any pain.  Check your lymph nodes daily for any changes.  Keep all follow-up visits as directed by your health care provider. This is important.  Contact a health care provider if:  Your lymph nodes are still swollen after 2 weeks.  Your swelling increases or spreads to other areas.  Your lymph nodes are hard, seem fixed to the skin, or are growing rapidly.  Your skin over the lymph nodes is red and inflamed.  You have a fever.  You have chills.  You have fatigue.  You develop a sore throat.  You have abdominal pain.  You have weight  loss.  You have night sweats. Get help right away if:  You notice fluid leaking from the area of the enlarged lymph node.  You have severe pain in any area of your body.  You have chest pain.  You have shortness of breath. This information is not intended to replace advice given to you by your health care provider. Make sure you discuss any questions you have with your health care provider. Document Released: 07/16/2008 Document Revised: 03/14/2016 Document Reviewed: 05/12/2014 Elsevier Interactive Patient Education  2018 [employer] Inc[employer]  

## 2018-09-28 NOTE — Progress Notes (Signed)
Patient ID: Jonathan Davidson, male    DOB: 11/14/1995  Age: 22 y.o. MRN: [id number]    Subjective:  Subjective  HPI  Jonathan Davidson presents for adenopathy in R side of neck.   He was here last year for the same thing.  It never got better.  He has had some sneezing and sore throat with trouble swallowing but no congestion, cough , sob or chest pain   Review of Systems  Constitutional: Negative.   HENT: Positive for sneezing and trouble swallowing. Negative for congestion, ear pain, hearing loss, nosebleeds, postnasal drip, rhinorrhea, sinus pressure and tinnitus.   Eyes: Negative for photophobia, discharge, itching and visual disturbance.  Respiratory: Negative.  Negative for cough, choking, chest tightness, shortness of breath and wheezing.   Cardiovascular: Negative.   Gastrointestinal: Negative for abdominal distention, abdominal pain, anal bleeding, blood in stool and constipation.  Endocrine: Negative.   Genitourinary: Negative.   Musculoskeletal: Negative.   Skin: Negative.   Allergic/Immunologic: Negative.   Neurological: Negative for dizziness, weakness, light-headedness, numbness and headaches.  Psychiatric/Behavioral: Negative for agitation, confusion, decreased concentration, dysphoric mood, sleep disturbance and suicidal ideas. The patient is not nervous/anxious.     History Past Medical History:  Diagnosis Date  . Allergy   . Attention deficit hyperactivity disorder (ADHD) 06/14/2016  . Scoliosis   . Varicocele 04/18/2018   left  . Vitiligo 2012    He has a past surgical history that includes No past surgeries.   His family history includes Diabetes in his paternal grandmother and unknown relative; Hypertension in his father and unknown relative.He reports that he has been smoking cigars. He started smoking about 3 years ago. He has never used smokeless tobacco. He reports that he does not drink alcohol or use drugs.  No current outpatient medications on file prior to visit.    No current facility-administered medications on file prior to visit.      Objective:  Objective  Physical Exam  Constitutional: He is oriented to person, place, and time. Vital signs are normal. He appears well-developed and well-nourished. He is sleeping.  HENT:  Head: Normocephalic and atraumatic.  Mouth/Throat: Oropharynx is clear and moist.  Eyes: Pupils are equal, round, and reactive to light. EOM are normal.  Neck: Normal range of motion. Neck supple. No thyromegaly present.    Cardiovascular: Normal rate and regular rhythm.  No murmur heard. Pulmonary/Chest: Effort normal and breath sounds normal. No respiratory distress. He has no wheezes. He has no rales. He exhibits no tenderness.  Musculoskeletal: He exhibits no edema or tenderness.  Neurological: He is alert and oriented to person, place, and time.  Skin: Skin is warm and dry.  Psychiatric: He has a normal mood and affect. His behavior is normal. Judgment and thought content normal.  Nursing note and vitals reviewed.  BP 98/60 (BP Location: Right Arm, Cuff Size: Normal)   Pulse 65   Temp 98.3 F (36.8 C) (Oral)   Resp 16   Ht 5\' 8"  (1.727 m)   Wt 146 lb 6.4 oz (66.4 kg)   SpO2 99%   BMI 22.26 kg/m  Wt Readings from Last 3 Encounters:  09/28/18 146 lb 6.4 oz (66.4 kg)  04/16/18 148 lb 6.4 oz (67.3 kg)  09/09/17 138 lb 3.2 oz (62.7 kg) (22 %, Z= -0.77)*   * Growth percentiles are based on CDC (Boys, 2-20 Years) data.     Lab Results  Component Value Date   WBC 5.8 09/28/2018   HGB  16.1 09/28/2018   HCT 48.3 09/28/2018   PLT 239.0 09/28/2018   GLUCOSE 92 04/15/2016   CHOL 124 04/15/2016   TRIG 88.0 04/15/2016   HDL 51.60 04/15/2016   LDLCALC 55 04/15/2016   ALT 17 04/15/2016   AST 24 04/15/2016   NA 139 04/15/2016   K 4.1 04/15/2016   CL 105 04/15/2016   CREATININE 1.06 04/15/2016   BUN 9 04/15/2016   CO2 29 04/15/2016    US Scrotum W/doppler  Result Date: 04/18/2018 CLINICAL DATA:  The  left testicle has felt larger than the right for years. EXAM: SCROTAL ULTRASOUND DOPPLER ULTRASOUND OF THE TESTICLES TECHNIQUE: Complete ultrasound examination of the testicles, epididymis, and other scrotal structures was performed. Color and spectral Doppler ultrasound were also utilized to evaluate blood flow to the testicles. COMPARISON:  None. FINDINGS: Right testicle Measurements: 4.2 x 2.1 x 2.6 cm. No mass or microlithiasis visualized. Left testicle Measurements: 3.9 x 2.1 x 2.9 cm. No mass or microlithiasis visualized. Right epididymis:  Normal in size and appearance. Left epididymis:  Normal in size and appearance. Hydrocele:  None visualized. Varicocele:  There is a left-sided varicocele. Pulsed Doppler interrogation of both testes demonstrates normal low resistance arterial and venous waveforms bilaterally. IMPRESSION: 1. Left-sided varicocele.  No other abnormalities. Electronically Signed   By: Gerome Sam III M.D   On: 04/18/2018 15:00     Assessment & Plan:  Plan  I am having Jonathan Amel start on loratadine.  Meds ordered this encounter  Medications  . loratadine (CLARITIN) 10 MG tablet    Sig: Take 1 tablet (10 mg total) by mouth daily.    Dispense:  30 tablet    Refill:  11    Problem List Items Addressed This Visit      Unprioritized   Localized swelling, mass or lump of neck    persistant Check CT and labs Consider ENT/ surgeon      Relevant Orders   CT Soft Tissue Neck W Contrast (Completed)   CBC with Differential/Platelet (Completed)   HIV Antibody (routine testing w rflx) (Completed)   HSV(herpes simplex vrs) 1+2 ab-IgG (Completed)   RPR (Completed)   Urine cytology ancillary only (Completed)    Other Visit Diagnoses    Upper respiratory tract infection, unspecified type    -  Primary   Relevant Medications   loratadine (CLARITIN) 10 MG tablet   High risk sexual behavior, unspecified type       Relevant Orders   CBC with Differential/Platelet  (Completed)   HIV Antibody (routine testing w rflx) (Completed)   HSV(herpes simplex vrs) 1+2 ab-IgG (Completed)   RPR (Completed)   Urine cytology ancillary only (Completed)      Follow-up: Return if symptoms worsen or fail to improve.  Donato Schultz, DO

## 2018-09-29 LAB — URINE CYTOLOGY ANCILLARY ONLY
Chlamydia: NEGATIVE
Neisseria Gonorrhea: NEGATIVE

## 2018-09-29 LAB — HIV ANTIBODY (ROUTINE TESTING W REFLEX): HIV: NONREACTIVE

## 2018-09-29 LAB — HSV(HERPES SIMPLEX VRS) I + II AB-IGG: HSV 2 IGG,TYPE SPECIFIC AB: <0.9 index

## 2018-09-29 LAB — RPR: RPR Ser Ql: NONREACTIVE

## 2018-09-30 DIAGNOSIS — R221 Localized swelling, mass and lump, neck: Secondary | ICD-10-CM | POA: Insufficient documentation

## 2018-09-30 NOTE — Assessment & Plan Note (Signed)
persistant Check CT and labs Consider ENT/ surgeon

## 2019-03-02 ENCOUNTER — Encounter (HOSPITAL_BASED_OUTPATIENT_CLINIC_OR_DEPARTMENT_OTHER): Payer: Self-pay

## 2019-03-02 ENCOUNTER — Emergency Department (HOSPITAL_BASED_OUTPATIENT_CLINIC_OR_DEPARTMENT_OTHER)
Admission: EM | Admit: 2019-03-02 | Discharge: 2019-03-03 | Disposition: A | Discharge status: Home / Self Care | Payer: BC Managed Care – PPO | Attending: Emergency Medicine | Admitting: Emergency Medicine

## 2019-03-02 ENCOUNTER — Other Ambulatory Visit: Payer: Self-pay

## 2019-03-02 DIAGNOSIS — F909 Attention-deficit hyperactivity disorder, unspecified type: Secondary | ICD-10-CM | POA: Insufficient documentation

## 2019-03-02 DIAGNOSIS — Z79899 Other long term (current) drug therapy: Secondary | ICD-10-CM | POA: Diagnosis not present

## 2019-03-02 DIAGNOSIS — R112 Nausea with vomiting, unspecified: Secondary | ICD-10-CM | POA: Insufficient documentation

## 2019-03-02 DIAGNOSIS — F17210 Nicotine dependence, cigarettes, uncomplicated: Secondary | ICD-10-CM | POA: Diagnosis not present

## 2019-03-02 LAB — COMPREHENSIVE METABOLIC PANEL
ALT: 32 U/L (ref 0–44)
AST: 49 U/L — ABNORMAL HIGH (ref 15–41)
Albumin: 5.1 g/dL — ABNORMAL HIGH (ref 3.5–5.0)
Alkaline Phosphatase: 39 U/L (ref 38–126)
Anion gap: 16 — ABNORMAL HIGH (ref 5–15)
BUN: 17 mg/dL (ref 6–20)
CO2: 18 mmol/L — ABNORMAL LOW (ref 22–32)
Calcium: 9.8 mg/dL (ref 8.9–10.3)
Chloride: 104 mmol/L (ref 98–111)
Creatinine, Ser: 1.22 mg/dL (ref 0.61–1.24)
GFR calc Af Amer: >60 mL/min (ref >60)
GFR calc non Af Amer: >60 mL/min (ref >60)
Glucose, Bld: 126 mg/dL — ABNORMAL HIGH (ref 70–99)
Potassium: 3.5 mmol/L (ref 3.5–5.1)
Sodium: 138 mmol/L (ref 135–145)
Total Bilirubin: 1.5 mg/dL — ABNORMAL HIGH (ref 0.3–1.2)
Total Protein: 7.8 g/dL (ref 6.5–8.1)

## 2019-03-02 LAB — CBC WITH DIFFERENTIAL/PLATELET
Abs Immature Granulocytes: 0.05 10*3/uL (ref 0.00–0.07)
Basophils Absolute: 0 10*3/uL (ref 0.0–0.1)
Basophils Relative: 0 %
Eosinophils Absolute: 0 10*3/uL (ref 0.0–0.5)
Eosinophils Relative: 0 %
HCT: 46.4 % (ref 39.0–52.0)
Hemoglobin: 15.2 g/dL (ref 13.0–17.0)
Immature Granulocytes: 0 %
Lymphocytes Relative: 11 %
Lymphs Abs: 1.4 10*3/uL (ref 0.7–4.0)
MCH: 27 pg (ref 26.0–34.0)
MCHC: 32.8 g/dL (ref 30.0–36.0)
MCV: 82.3 fL (ref 80.0–100.0)
Monocytes Absolute: 0.7 10*3/uL (ref 0.1–1.0)
Monocytes Relative: 6 %
Neutro Abs: 10.6 10*3/uL — ABNORMAL HIGH (ref 1.7–7.7)
Neutrophils Relative %: 83 %
Platelets: 251 10*3/uL (ref 150–400)
RBC: 5.64 MIL/uL (ref 4.22–5.81)
RDW: 13.4 % (ref 11.5–15.5)
WBC: 12.8 10*3/uL — ABNORMAL HIGH (ref 4.0–10.5)
nRBC: 0 % (ref 0.0–0.2)

## 2019-03-02 LAB — LIPASE, BLOOD: Lipase: 32 U/L (ref 11–51)

## 2019-03-02 MED ORDER — ONDANSETRON HCL 4 MG/2ML IJ SOLN
4.0000 mg | Freq: Once | INTRAMUSCULAR | Status: AC
  Administered 2019-03-02: 4 mg via INTRAVENOUS
  Filled 2019-03-02: qty 2

## 2019-03-02 MED ORDER — SODIUM CHLORIDE 0.9 % IV BOLUS
1000.0000 mL | Freq: Once | INTRAVENOUS | Status: AC
  Administered 2019-03-02: 1000 mL via INTRAVENOUS

## 2019-03-02 MED ORDER — METOCLOPRAMIDE HCL 5 MG/ML IJ SOLN
10.0000 mg | Freq: Once | INTRAMUSCULAR | Status: AC
  Administered 2019-03-02: 10 mg via INTRAVENOUS
  Filled 2019-03-02: qty 2

## 2019-03-02 MED ORDER — HYDROXYZINE HCL 25 MG PO TABS
50.0000 mg | ORAL_TABLET | Freq: Once | ORAL | Status: AC
  Administered 2019-03-02: 50 mg via ORAL
  Filled 2019-03-02: qty 2

## 2019-03-02 NOTE — ED Triage Notes (Signed)
Pt c/o n/v "all day" every time he inhales and exhales, pt is hyperventilating, father had to come in with him in order for him to be seen

## 2019-03-02 NOTE — ED Provider Notes (Signed)
[facility] EMERGENCY DEPARTMENT Provider Note   CSN: [id number] Arrival date & time: 03/02/19  2316    History   Chief Complaint Chief Complaint  Patient presents with  . Emesis    HPI Jonathan Davidson is a 23 y.o. male.     Patient presents to the emergency department complaints of nausea and vomiting.  Patient reports that symptoms began earlier this morning.  He has not eaten all day.  He estimates that he has vomited 20 times.  He feels like he might have diarrhea, but has not had any bowel movements today.  He is not experiencing any abdominal pain.     Past Medical History:  Diagnosis Date  . Allergy   . Attention deficit hyperactivity disorder (ADHD) 06/14/2016  . Scoliosis   . Varicocele 04/18/2018   left  . Vitiligo 2012    Patient Active Problem List   Diagnosis Date Noted  . Localized swelling, mass or lump of neck 09/30/2018  . Varicocele 04/18/2018  . Lymphadenopathy 09/09/2017  . Cough 09/09/2017  . Cigar smoker unmotivated to quit 09/09/2017  . Attention deficit hyperactivity disorder (ADHD) 06/14/2016  . Body aches 05/22/2016  . SOB (shortness of breath) 03/24/2015  . Allergic rhinitis 03/24/2015  . Vitiligo 03/24/2015  . General medical examination 08/16/2011    Past Surgical History:  Procedure Laterality Date  . NO PAST SURGERIES          Home Medications    Prior to Admission medications   Medication Sig Start Date End Date Taking? Authorizing Provider  loratadine (CLARITIN) 10 MG tablet Take 1 tablet (10 mg total) by mouth daily. 09/28/18   Donato Schultz, DO    Family History Family History  Problem Relation Age of Onset  . Hypertension Father   . Diabetes Other        GM  . Hypertension Other        F  . Diabetes Paternal Grandmother   . Coronary artery disease Neg Hx   . Cancer Neg Hx     Social History Social History   Tobacco Use  . Smoking status: Current Every Day Smoker    Types: Cigars   Start date: 09/10/2015  . Smokeless tobacco: Never Used  . Tobacco comment: 3-4 black and mild cigars   Substance Use Topics  . Alcohol use: No    Alcohol/week: 0.0 standard drinks  . Drug use: No     Allergies   Patient has no known allergies.   Review of Systems Review of Systems  Gastrointestinal: Positive for nausea and vomiting.  All other systems reviewed and are negative.    Physical Exam Updated Vital Signs BP 123/78 (BP Location: Right Arm)   Pulse 98   Temp 98.2 F (36.8 C) (Oral)   Resp (!) 22   Ht  (1.727 m)   Wt 63.5 kg   SpO2 100%   BMI 21.29 kg/m   Physical Exam Vitals signs and nursing note reviewed.  Constitutional:      General: He is in acute distress (hyperventilating).     Appearance: Normal appearance. He is well-developed.  HENT:     Head: Normocephalic and atraumatic.     Right Ear: Hearing normal.     Left Ear: Hearing normal.     Nose: Nose normal.  Eyes:     Conjunctiva/sclera: Conjunctivae normal.     Pupils: Pupils are equal, round, and reactive to light.  Neck:  Musculoskeletal: Normal range of motion and neck supple.  Cardiovascular:     Rate and Rhythm: Regular rhythm.     Heart sounds: S1 normal and S2 normal. No murmur. No friction rub. No gallop.   Pulmonary:     Effort: Pulmonary effort is normal. Tachypnea present. No respiratory distress.     Breath sounds: Normal breath sounds.  Chest:     Chest wall: No tenderness.  Abdominal:     General: Bowel sounds are normal.     Palpations: Abdomen is soft.     Tenderness: There is no abdominal tenderness. There is no guarding or rebound. Negative signs include Murphy's sign and McBurney's sign.     Hernia: No hernia is present.  Musculoskeletal: Normal range of motion.  Skin:    General: Skin is warm and dry.     Findings: No rash.  Neurological:     Mental Status: He is alert and oriented to person, place, and time.     GCS: GCS eye subscore is 4. GCS verbal  subscore is 5. GCS motor subscore is 6.     Cranial Nerves: No cranial nerve deficit.     Sensory: No sensory deficit.     Coordination: Coordination normal.  Psychiatric:        Mood and Affect: Mood is anxious.        Speech: Speech normal.        Behavior: Behavior normal.        Thought Content: Thought content normal.      ED Treatments / Results  Labs (all labs ordered are listed, but only abnormal results are displayed) Labs Reviewed  CBC WITH DIFFERENTIAL/PLATELET - Abnormal; Notable for the following components:      Result Value   WBC 12.8 (*)    Neutro Abs 10.6 (*)    All other components within normal limits  COMPREHENSIVE METABOLIC PANEL - Abnormal; Notable for the following components:   CO2 18 (*)    Glucose, Bld 126 (*)    Albumin 5.1 (*)    AST 49 (*)    Total Bilirubin 1.5 (*)    Anion gap 16 (*)    All other components within normal limits  LIPASE, BLOOD  RAPID URINE DRUG SCREEN, HOSP PERFORMED    EKG None  Radiology No results found.  Procedures Procedures (including critical care time)  Medications Ordered in ED Medications  sodium chloride 0.9 % bolus 1,000 mL (1,000 mLs Intravenous New Bag/Given 03/02/19 2340)  ondansetron (ZOFRAN) injection 4 mg (4 mg Intravenous Given 03/02/19 2341)  metoCLOPramide (REGLAN) injection 10 mg (10 mg Intravenous Given 03/02/19 2342)  hydrOXYzine (ATARAX/VISTARIL) tablet 50 mg (50 mg Oral Given 03/02/19 2342)     Initial Impression / Assessment and Plan / ED Course  I have reviewed the triage vital signs and the nursing notes.  Pertinent labs & imaging results that were available during my care of the patient were reviewed by me and considered in my medical decision making (see chart for details).        Patient presents to the emergency department for evaluation of nausea and vomiting.  Symptoms have been ongoing for 1 day.  He reports that he has been unable to eat or drink because of the vomiting.   Patient hyperventilating and extremely anxious at arrival.  Abdominal exam, however, is benign, no significant tenderness.  No signs of acute surgical process on exam.  Blood work reassuring.  Patient treated with IV fluids,  antiemetics and has had significant improvement.  He is resting comfortably.  As the exam is benign, does not require imaging at this time.  Continue to treat symptomatically.  Return for worsening symptoms.  Final Clinical Impressions(s) / ED Diagnoses   Final diagnoses:  Non-intractable vomiting with nausea, unspecified vomiting type    ED Discharge Orders    None       Gilda Crease, MD 03/03/19 [id number]

## 2019-03-03 LAB — RAPID URINE DRUG SCREEN, HOSP PERFORMED
Amphetamines: NOT DETECTED
Barbiturates: NOT DETECTED
Benzodiazepines: NOT DETECTED
Cocaine: NOT DETECTED
Opiates: NOT DETECTED
Tetrahydrocannabinol: POSITIVE — AB

## 2019-03-03 MED ORDER — ONDANSETRON HCL 4 MG PO TABS: 4.0000 mg | ORAL_TABLET | Freq: Four times a day (QID) | ORAL | 0 refills | Status: DC | PRN

## 2019-03-03 MED ORDER — METOCLOPRAMIDE HCL 10 MG PO TABS: 10.0000 mg | ORAL_TABLET | Freq: Four times a day (QID) | ORAL | 0 refills | Status: DC | PRN

## 2019-03-03 NOTE — ED Notes (Signed)
Pt sipping ginger ale, would like to go home

## 2019-03-03 NOTE — ED Notes (Signed)
Pt verbalizes understanding of d/c instructions and denies any further need at this time. 

## 2019-03-03 NOTE — ED Notes (Signed)
Pt ambulated to restroom without difficulty, verbalizes that he feels better

## 2019-03-03 NOTE — ED Notes (Signed)
Pt is currently resting, dad at bedside

## 2019-04-09 ENCOUNTER — Inpatient Hospital Stay: Admit: 2019-04-09 | Payer: BC Managed Care – PPO

## 2019-04-09 ENCOUNTER — Telehealth: Payer: Self-pay

## 2019-04-09 ENCOUNTER — Ambulatory Visit
Admission: RE | Admit: 2019-04-09 | Discharge: 2019-04-09 | Disposition: A | Discharge status: Home / Self Care | Payer: BC Managed Care – PPO | Source: Ambulatory Visit

## 2019-04-09 ENCOUNTER — Ambulatory Visit (INDEPENDENT_AMBULATORY_CARE_PROVIDER_SITE_OTHER): Payer: BC Managed Care – PPO | Admitting: Medical

## 2019-04-09 ENCOUNTER — Telehealth: Payer: BC Managed Care – PPO | Admitting: Family

## 2019-04-09 VITALS — Ht 68.0 in | Wt 135.0 lb

## 2019-04-09 DIAGNOSIS — R634 Abnormal weight loss: Secondary | ICD-10-CM | POA: Diagnosis not present

## 2019-04-09 DIAGNOSIS — R5383 Other fatigue: Secondary | ICD-10-CM

## 2019-04-09 DIAGNOSIS — R195 Other fecal abnormalities: Secondary | ICD-10-CM

## 2019-04-09 DIAGNOSIS — K591 Functional diarrhea: Secondary | ICD-10-CM

## 2019-04-09 DIAGNOSIS — K5900 Constipation, unspecified: Secondary | ICD-10-CM

## 2019-04-09 MED ORDER — MEBENDAZOLE 100 MG PO CHEW: 100.0000 mg | CHEWABLE_TABLET | Freq: Two times a day (BID) | ORAL | 0 refills | Status: DC

## 2019-04-09 NOTE — Patient Instructions (Addendum)
Patient has complaints today of episodes of rare and occasional diarrhea and vomiting.  One event occurred about 6 weeks ago and another event of loose stools only occurred 2 weeks ago.  Far from that he mostly reports some constipation type bowel movements over the last 2weeks but on further description describes more of a small hard stool daily basis.  Minimal transient abdomen pain only if he strains to have a bowel movement.  No recent vomiting presently.  He also reports history of weight loss over the past 2 months.  He cannot gain weight back despite eating and exercising.  He expresses concern for parasite/worms.  Discussed with patient today that I could prescribe him mebendazole 100 mg twice daily for 3days.  If this is very expensive as a prescription or not available ask pharmacy if they have over-the-counter version.  He can take this and will see how he responds.  If he has any recurrent loose stools/diarrhea as he described intermittently then he could call our office to get scheduled for gastro panel.  Place standing future order.  Presently more constipated and if he has significant constipation that he could use Dulcolax over-the-counter.  Presently recommend that he hydrate well daily and get some daily exercise.  Eat healthy as well.  At discussing patient is a weight loss he did also mention fatigue so asked him to follow-up in 2 weeks with his PCP or myself if PCP not available.  With plan to do labs to include CBC, CMP, B12, B1, and TSH.  May be imaging studies of the abdomen if physical exam indicates needed.

## 2019-04-09 NOTE — Progress Notes (Signed)
Subjective:    Patient ID: Jonathan Davidson, male    DOB: 06/22/1996, 23 y.o.   MRN: [id number]  HPI  Virtual Visit via Video Note  I connected with Jonathan Davidson on 04/09/19 at  1:20 PM EDT by a video enabled telemedicine application and verified that I am speaking with the correct person using two identifiers.  Location: Patient: car. Parked. Provider:home.  Patient did not check vital signs today.   I discussed the limitations of evaluation and management by telemedicine and the availability of in person appointments. The patient expressed understanding and agreed to proceed.  History of Present Illness:  Pt in states one month and half ago. Pt states one month and half ago he had episode of vomiting after eating. He went to ED and he was told had food poisining. The diarrhea last 4 days then resolved.   Then 2 weeks ago he had one incidence of loose stools that was yellow. He states loose stool lasted only one time. Now reports being more constipated at time but not severe. If has to have bm will strain at times. Last bm was.   No rectal itching. No foreign travel outside country. No worms seen in stool.  Pt expresses concern for parasites.  Last bm last night was solid but yellow in color. Smaller hard too recently at times.   Pt states he lost weight and has not gained it back. Lost from 148 lb to about 130 lb about 2 months. Can't gain wait back despite eating.   Objective General-no acute distress, pleasant, oriented. Lungs- on inspection lungs appear unlabored. Neck- no tracheal deviation or jvd on inspection. Neuro- gross motor function appears intact. Abdomen- not tender presently per pt.      Assessment/plan Patient has complaints today of episodes of rare and occasional diarrhea and vomiting.  One event occurred about 6 weeks ago and another event of loose stools only occurred 2 weeks ago.  Far from that he mostly reports some constipation type bowel movements over  the last 2weeks but on further description describes more of a small hard stool daily basis.  Minimal transient abdomen pain only if he strains to have a bowel movement.  No recent vomiting presently.  He also reports history of weight loss over the past 2 months.  He cannot gain weight back despite eating and exercising.  He expresses concern for parasite/worms.  Discussed with patient today that I could prescribe him mebendazole 100 mg twice daily for 3days.  If this is very expensive as a prescription or not available ask pharmacy if they have over-the-counter version.  He can take this and will see how he responds.  If he has any recurrent loose stools/diarrhea as he described intermittently then he could call our office to get scheduled for gastro panel.  Place standing future order.  Presently more constipated and if he has significant constipation that he could use Dulcolax over-the-counter.  Presently recommend that he hydrate well daily and get some daily exercise.  Eat healthy as well.  At discussing patient is a weight loss he did also mention fatigue so asked him to follow-up in 2 weeks with his PCP or myself if PCP not available.  With plan to do labs to include CBC, CMP, B12, B1, and TSH.  May be imaging studies of the abdomen if physical exam indicates needed.  Mackie Pai, PA-C     I discussed the assessment and treatment plan with the patient. The patient was  provided an opportunity to ask questions and all were answered. The patient agreed with the plan and demonstrated an understanding of the instructions.   The patient was advised to call back or seek an in-person evaluation if the symptoms worsen or if the condition fails to improve as anticipated.  25 minutes spent with pt. couneled pt on diagnosis and treatment. Answered pt question.   Esperanza RichtersEdward Breanna Mcdaniel, PA-C   Review of Systems     Objective:   Physical Exam        Assessment & Plan:

## 2019-04-09 NOTE — Progress Notes (Signed)
Based on what you shared with me, I feel your condition warrants further evaluation and I recommend that you be seen for a face to face office visit.  It appears you are at the urgent care. Please be seen as scheduled.  NOTE: If you entered your credit card information for this eVisit, you will not be charged. You may see a "hold" on your card for the $35 but that hold will drop off and you will not have a charge processed.  If you are having a true medical emergency please call 911.     For an urgent face to face visit, [place] has five urgent care centers for your convenience:    [email or web address] to reserve your spot online an avoid wait times  [facility] [address] [place], [place] [address] Modified hours of operation: Monday-Friday, 12 PM to 6 PM  Closed Saturday & Sunday  *Across the street from [place] (New Address!) [address] [place], [place] [address] *Just off [address], across the road from [place] hours of operation: Monday-Friday, 12 PM to 6 PM  Closed Saturday & Sunday   The following sites will take your insurance:  . [facility]    [phone]                  Get Driving Directions  [address], [place] [address] . 10 am to 8 pm Monday-Friday . 12 pm to 8 pm Saturday-Sunday   . [facility] at [place]                  Get Driving Directions  [address], [place], [place] [address] . 8 am to 8 pm Monday-Friday . 9 am to 6 pm Saturday . 11 am to 6 pm Sunday   . [facility] at [place]                  Get Driving Directions   [address] [place], [place] [address] . 8 am to 8 pm Monday-Friday . 8 am to 4 pm Saturday-Sunday    . [facility] at [place]  [address], [place], [place] [address]  . Monday-Friday, 12 PM to 6 PM    Your e-visit answers were reviewed by a board certified advanced clinical practitioner to complete your personal care plan.  Thank you for using e-Visits.

## 2019-04-09 NOTE — Telephone Encounter (Signed)
Copied from [place] [id number]. Topic: General - Other >> Apr 09, 2019  8:02 AM Leward Quan A wrote: Reason for CRM: Patient called to get scheduled to see his PCP regarding weight loss and parasites. Ph# [phone]

## 2019-04-10 ENCOUNTER — Encounter: Payer: Self-pay | Admitting: Family Medicine

## 2019-04-12 NOTE — Telephone Encounter (Signed)
Appt w/ Percell Miller 04/09/2019.

## 2019-04-14 ENCOUNTER — Telehealth: Payer: Self-pay | Admitting: Family Medicine

## 2019-04-14 ENCOUNTER — Telehealth: Payer: BC Managed Care – PPO | Admitting: Family

## 2019-04-14 DIAGNOSIS — J301 Allergic rhinitis due to pollen: Secondary | ICD-10-CM | POA: Diagnosis not present

## 2019-04-14 MED ORDER — FLUTICASONE PROPIONATE 50 MCG/ACT NA SUSP: 2.0000 | Freq: Every day | NASAL | 6 refills | Status: DC

## 2019-04-14 MED ORDER — LEVOCETIRIZINE DIHYDROCHLORIDE 5 MG PO TABS: 5.0000 mg | ORAL_TABLET | Freq: Every evening | ORAL | 2 refills | Status: DC

## 2019-04-14 NOTE — Progress Notes (Signed)
E visit for Allergic Rhinitis We are sorry that you are not feeling well.  Here is how we plan to help!  Based on what you have shared with me it looks like you have Allergic Rhinitis.  Rhinitis is when a reaction occurs that causes nasal congestion, runny nose, sneezing, and itching.  Most types of rhinitis are caused by an inflammation and are associated with symptoms in the eyes ears or throat. There are several types of rhinitis.  The most common are acute rhinitis, which is usually caused by a viral illness, allergic or seasonal rhinitis, and nonallergic or year-round rhinitis.  Nasal allergies occur certain times of the year.  Allergic rhinitis is caused when allergens in the air trigger the release of histamine in the body.  Histamine causes itching, swelling, and fluid to build up in the fragile linings of the nasal passages, sinuses and eyelids.  An itchy nose and clear discharge are common.  I recommend the following over the counter treatments: You should take a daily dose of antihistamine and Xyzal 5 mg take 1 tablet daily  I also would recommend a nasal spray: Flonase 2 sprays into each nostril once daily  Approximately 5 minutes was spent documenting and reviewing patient's chart.    HOME CARE:   You can use an over-the-counter saline nasal spray as needed  Avoid areas where there is heavy dust, mites, or molds  Stay indoors on windy days during the pollen season  Keep windows closed in home, at least in bedroom; use air conditioner.  Use high-efficiency house air filter  Keep windows closed in car, turn AC on re-circulate  Avoid playing out with dog during pollen season  GET HELP RIGHT AWAY IF:   If your symptoms do not improve within 10 days  You become short of breath  You develop yellow or green discharge from your nose for over 3 days  You have coughing fits  MAKE SURE YOU:   Understand these instructions  Will watch your condition  Will get help  right away if you are not doing well or get worse  Thank you for choosing an e-visit. Your e-visit answers were reviewed by a board certified advanced clinical practitioner to complete your personal care plan. Depending upon the condition, your plan could have included both over the counter or prescription medications. Please review your pharmacy choice. Be sure that the pharmacy you have chosen is open so that you can pick up your prescription now.  If there is a problem you may message your provider in MyChart to have the prescription routed to another pharmacy. Your safety is important to us. If you have drug allergies check your prescription carefully.  For the next 24 hours, you can use MyChart to ask questions about today's visit, request a non-urgent call back, or ask for a work or school excuse from your e-visit provider. You will get an email in the next two days asking about your experience. I hope that your e-visit has been valuable and will speed your recovery.         

## 2019-04-14 NOTE — Telephone Encounter (Signed)
Pt notified of recommendation Verbalizes understanding 

## 2019-04-26 ENCOUNTER — Ambulatory Visit: Payer: BC Managed Care – PPO | Admitting: Family Medicine

## 2019-05-04 ENCOUNTER — Ambulatory Visit: Payer: BC Managed Care – PPO | Admitting: Family Medicine

## 2019-05-04 DIAGNOSIS — Z0289 Encounter for other administrative examinations: Secondary | ICD-10-CM

## 2019-07-05 ENCOUNTER — Telehealth: Payer: Self-pay | Admitting: *Deleted

## 2019-07-05 NOTE — Telephone Encounter (Signed)
2nd call  Call Type Triage / Clinical Relationship To Patient Self Return Phone Number [phone] (Primary) Chief Complaint EYE - something stuck (except contacts) or splashed in eye Reason for Call Symptomatic / Request for [place] has had eye redness for 2-3 days. Nurse earlier recommended to rinse the eye, unable to do this because of work. Redness has improved some, but sees something on top of his eye now he could scrap off. Translation No Nurse Assessment Nurse: Nicki Reaper, RN, Malachy Mood Date/Time (Eastern Time): 07/04/2019 7:32:49 PM Confirm and document reason for call. If symptomatic, describe symptoms. 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 Triangle in cornor of eye is bigger.   Disp. Time Eilene Ghazi Time) Disposition Final User 07/04/2019 7:31:21 PM Send to Urgent [place], [place] 07/04/2019 7:40:21 PM Go to ED Now Yes Nicki Reaper, RN, Malachy Mood

## 2019-07-05 NOTE — Telephone Encounter (Signed)
Who Is Calling Patient / Member / Family / Caregiver Call Type Triage / Clinical Relationship To Patient Self Return Phone Number [phone] (Primary) Chief Complaint EYE - something stuck (except contacts) or splashed in eye Reason for Call Symptomatic / Request for Health Information Initial Comment Caller states he has been experiencing redness in the inner corner on his right eye. Caller states he is experiencing pain every time he closes his eye or blinks. Caller states he may have something stuck in his eye, but does not know what it may be. Translation No Nurse Assessment Nurse: Loreen Freud, RN, Crystal Date/Time (Eastern Time): 07/04/2019 11:09:56 AM Confirm and document reason for call. If symptomatic, describe symptoms. ---Caller states he has been experiencing redness in the inner corner on his right eye. Caller states he is experiencing pain every time he closes his eye or blinks. Caller states he may have something stuck in his eye, but does not know what it may be. Eye is a little puffy in the inner corner. It has been this way for 2 days. No drainage.   Disp. Time Eilene Ghazi Time) Disposition Final User 07/04/2019 11:08:15 AM Send to Urgent Paula [place], [place] 07/04/2019 11:22:43 AM See PCP within 24 Hours Yes [place], RN, Crystal Caller Disagree/Comply Comply Caller Understands Yes PreDisposition Did not know what to do

## 2019-07-05 NOTE — Telephone Encounter (Signed)
No appt made yet for patient.  Tried to call to check status and possibly make appt.  No answer/ no vm

## 2020-02-12 IMAGING — CT CT NECK W/ CM
4 of 5 series · 15 of 33 positions shown, 17 images · IV contrast (iopamidol)
Comparison: None.

CLINICAL DATA: Solitary neck mass

EXAM:
CT NECK WITH CONTRAST
TECHNIQUE: Multidetector CT imaging of the neck was performed using the
standard protocol following the bolus administration of intravenous
contrast.
CONTRAST:  75mL ILS228-XOO IOPAMIDOL (ILS228-XOO) INJECTION 61%

[Series 2: axial neck · axial · 0.57mm/px · z∈[-199,-81]mm · 3 of 119 slices shown]
[im 30/119  bone]
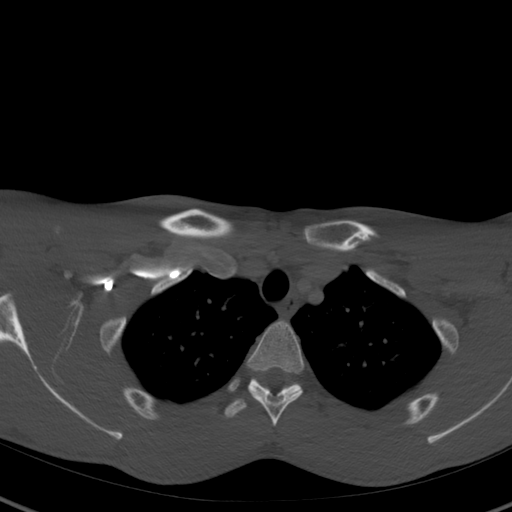
[im 60/119  bone]
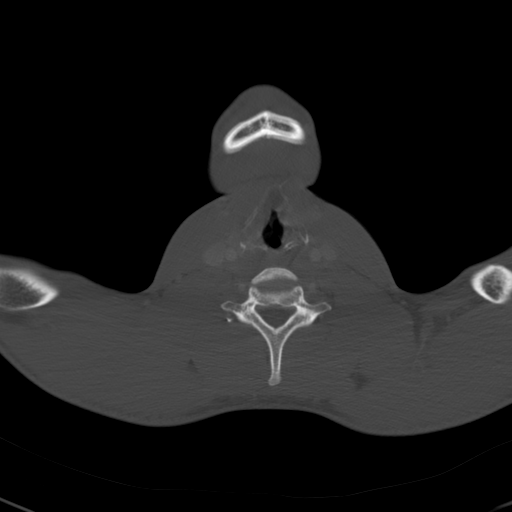
[im 89/119  bone]
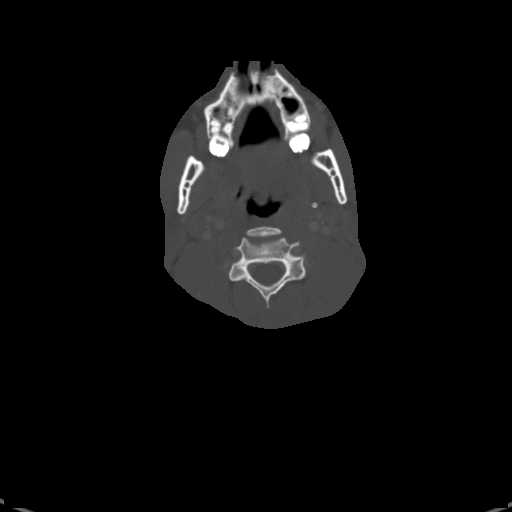

[Series 4: sag neck · sagittal · 0.51mm/px · 5 of 101 slices shown, 6 images]
[im 34/101  bone]
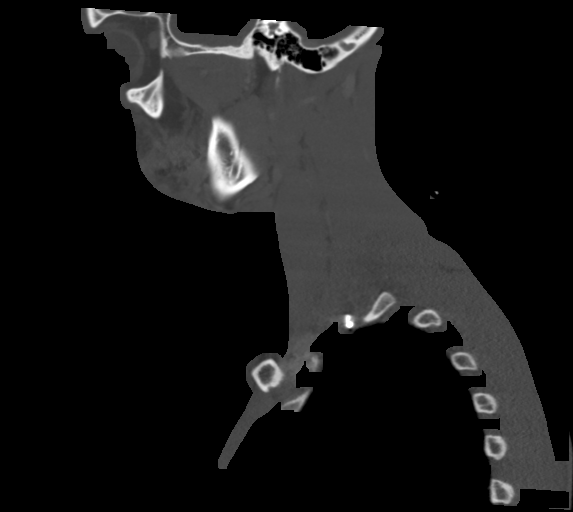
[im 42/101  bone]
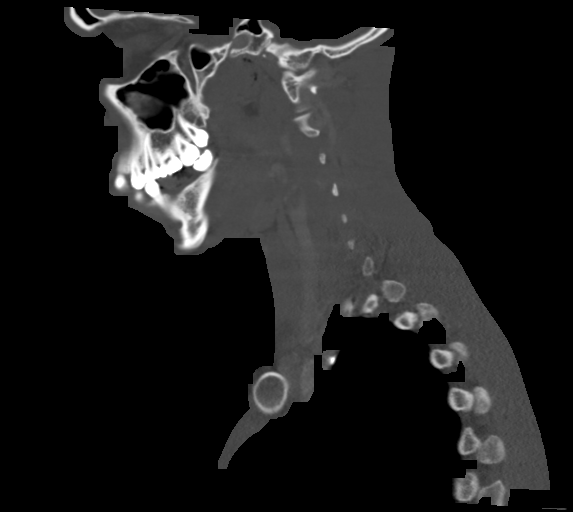
[im 51/101  soft-tissue]
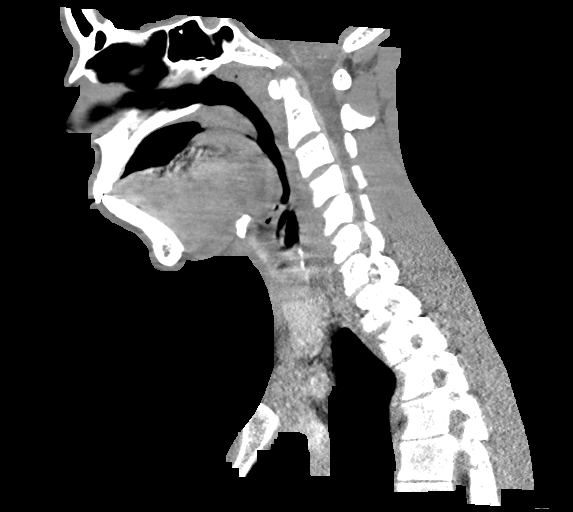
[im 51/101  bone]
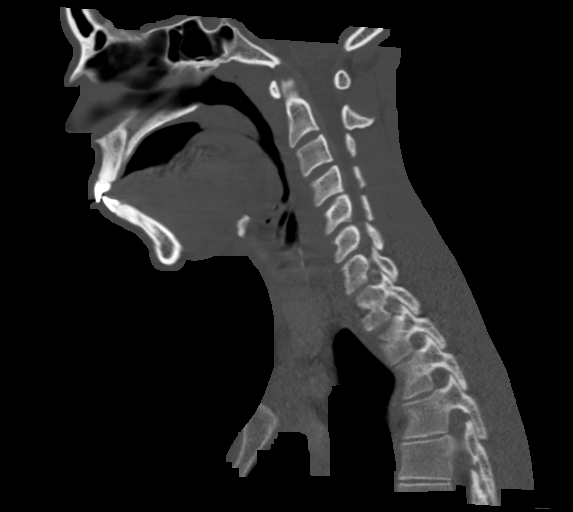
[im 59/101  bone]
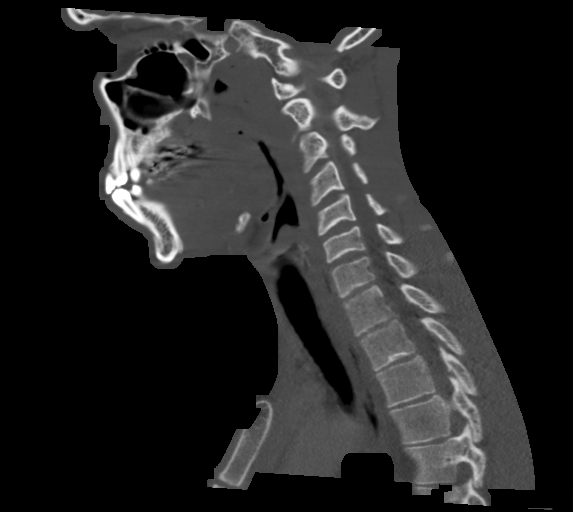
[im 67/101  bone]
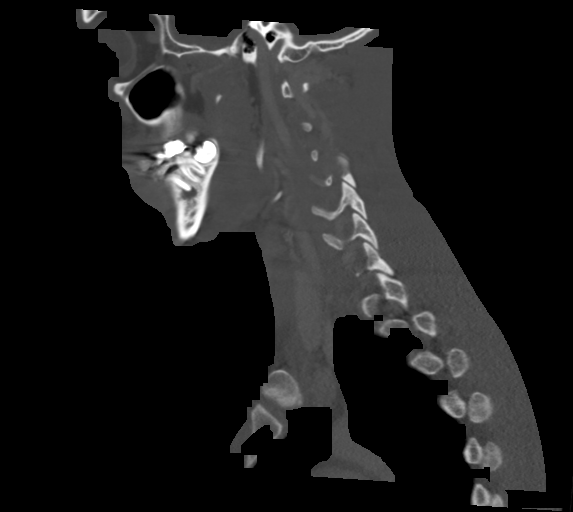

[Series 5: cor neck · coronal · 0.35mm/px · 3 of 140 slices shown]
[im 28/140  bone]
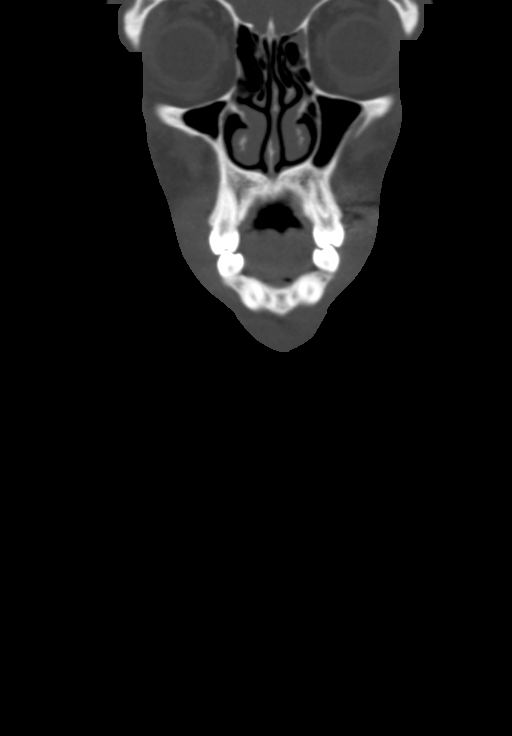
[im 56/140  bone]
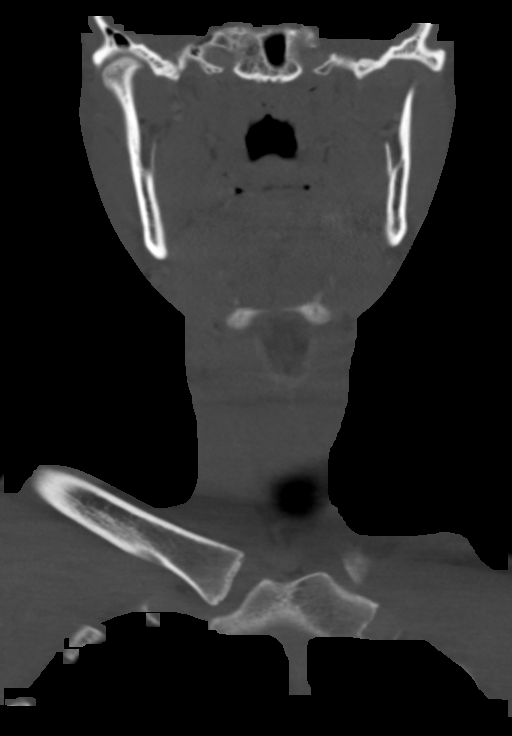
[im 84/140  bone]
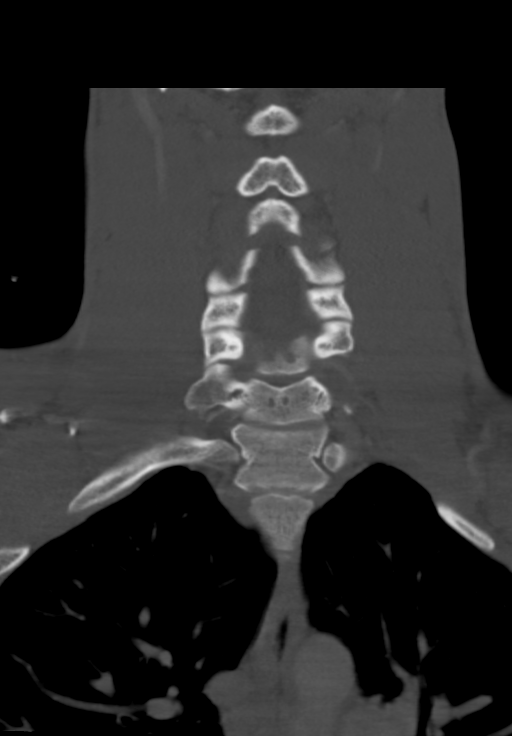

[Series 6: orthogonal ax · axial · 0.45mm/px · z∈[-236,-87]mm · 4 of 127 slices shown, 5 images]
[im 26/127  soft-tissue]
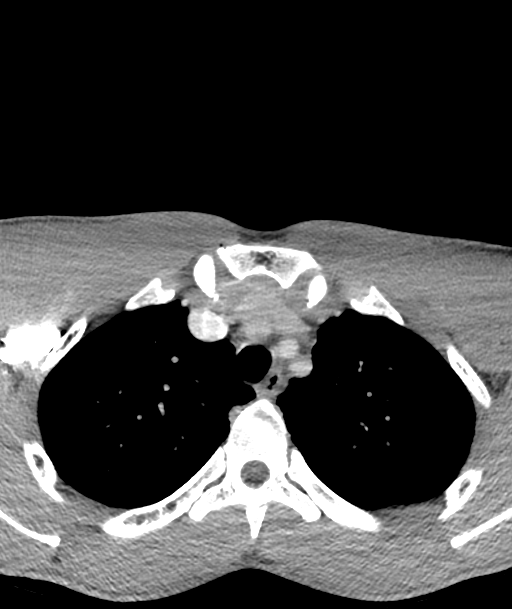
[im 26/127  bone]
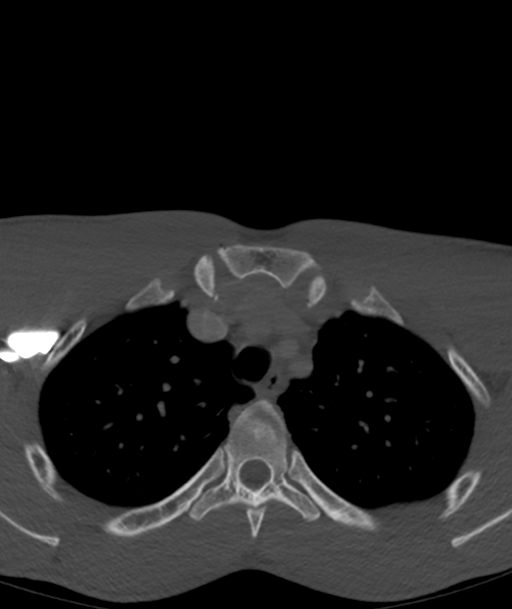
[im 51/127  bone]
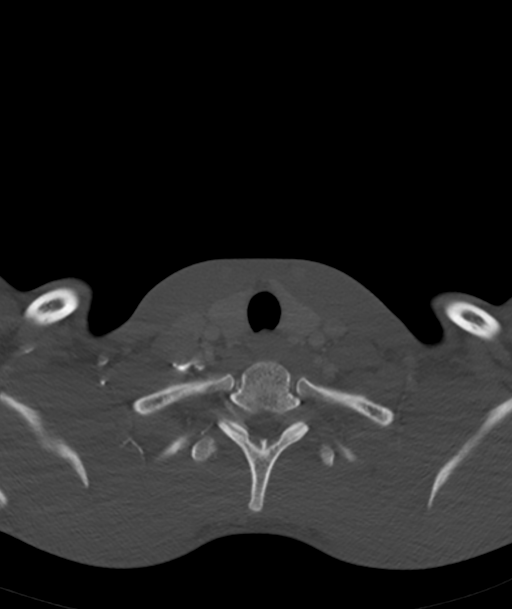
[im 76/127  bone]
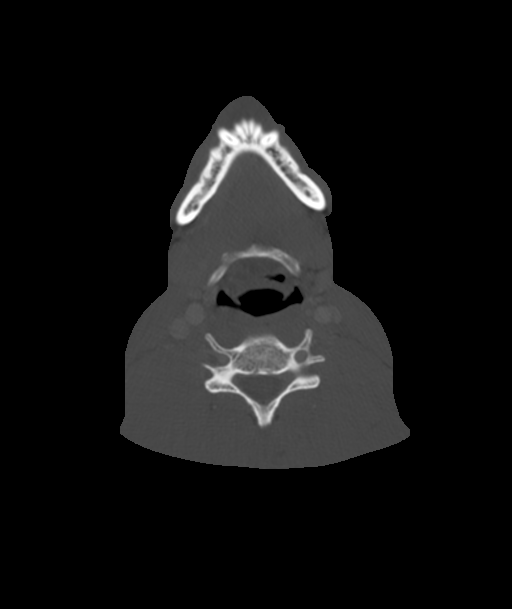
[im 101/127  bone]
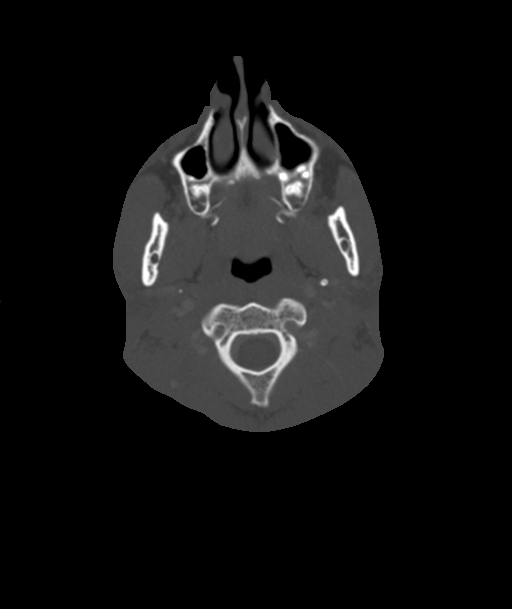

[15 of 33 positions shown; findings below may reference images not displayed]

FINDINGS: Pharynx and larynx: No evident mass or inflammation. Motion at the
level of the glottis, but still diagnostic.

Salivary glands: No inflammation, mass, or stone.

Thyroid: Normal.

Lymph nodes: None enlarged or abnormal density.

Vascular: Negative.

Limited intracranial: Negative.

Visualized orbits: Negative.

Mastoids and visualized paranasal sinuses: Clear.

Skeleton: No acute or aggressive process.

Upper chest: Negative.
IMPRESSION: Negative exam.  No evident mass or adenopathy.

## 2020-08-10 ENCOUNTER — Other Ambulatory Visit: Payer: Self-pay

## 2020-08-10 ENCOUNTER — Encounter (HOSPITAL_BASED_OUTPATIENT_CLINIC_OR_DEPARTMENT_OTHER): Payer: Self-pay | Admitting: *Deleted

## 2020-08-10 ENCOUNTER — Emergency Department (HOSPITAL_BASED_OUTPATIENT_CLINIC_OR_DEPARTMENT_OTHER)
Admission: EM | Admit: 2020-08-10 | Discharge: 2020-08-11 | Disposition: A | Discharge status: Home / Self Care | Payer: BC Managed Care – PPO | Attending: Emergency Medicine | Admitting: Emergency Medicine

## 2020-08-10 ENCOUNTER — Telehealth: Payer: Self-pay | Admitting: Family Medicine

## 2020-08-10 DIAGNOSIS — F909 Attention-deficit hyperactivity disorder, unspecified type: Secondary | ICD-10-CM | POA: Insufficient documentation

## 2020-08-10 DIAGNOSIS — S60512A Abrasion of left hand, initial encounter: Secondary | ICD-10-CM | POA: Insufficient documentation

## 2020-08-10 DIAGNOSIS — S6992XA Unspecified injury of left wrist, hand and finger(s), initial encounter: Secondary | ICD-10-CM | POA: Diagnosis present

## 2020-08-10 DIAGNOSIS — F17290 Nicotine dependence, other tobacco product, uncomplicated: Secondary | ICD-10-CM | POA: Insufficient documentation

## 2020-08-10 DIAGNOSIS — W548XXA Other contact with dog, initial encounter: Secondary | ICD-10-CM | POA: Insufficient documentation

## 2020-08-10 NOTE — Telephone Encounter (Signed)
Pt needs an office visit

## 2020-08-10 NOTE — ED Triage Notes (Signed)
C/o abrasion to left hand x 2 days ago , and hes worried today because his puppy licked the wound

## 2020-08-10 NOTE — Telephone Encounter (Signed)
Patient called requesting to speak with Dr Laury Axon.  Patient states he injured his hand while working for [employer] and a day later and dog came up to him wanting to play . Patient is afraid of possible infection

## 2020-08-11 NOTE — ED Provider Notes (Signed)
[facility] Provider Note: Lowella Dell, MD, FACEP  CSN: [id number] MRN: [id number] ARRIVAL: 08/10/20 at 2320 ROOM: MH03/[facility]03   CHIEF COMPLAINT  Abrasion   HISTORY OF PRESENT ILLNESS  08/11/20 12:43 AM Jonathan Davidson is a 24 y.o. male with an abrasion to his left hand that occurred 2 days ago.  He struck it against part of the door.  A bystander applied hydrogen peroxide to it there was bleeding at the time but that has been controlled since.  There is minimal associated pain.  He is here because his puppy licked the wound yesterday and he is concerned about infection.  His tetanus is up-to-date.    Past Medical History:  Diagnosis Date  . Allergy   . Attention deficit hyperactivity disorder (ADHD) 06/14/2016  . Scoliosis   . Varicocele 04/18/2018   left  . Vitiligo 2012    Past Surgical History:  Procedure Laterality Date  . NO PAST SURGERIES      Family History  Problem Relation Age of Onset  . Hypertension Father   . Diabetes Other        GM  . Hypertension Other        F  . Diabetes Paternal Grandmother   . Coronary artery disease Neg Hx   . Cancer Neg Hx     Social History   Tobacco Use  . Smoking status: Current Every Day Smoker    Types: Cigars    Start date: 09/10/2015  . Smokeless tobacco: Never Used  . Tobacco comment: 3-4 black and mild cigars   Substance Use Topics  . Alcohol use: No    Alcohol/week: 0.0 standard drinks  . Drug use: No    Prior to Admission medications   Medication Sig Start Date End Date Taking? Authorizing Provider  fluticasone (FLONASE) 50 MCG/ACT nasal spray Place 2 sprays into both nostrils daily. 04/14/19   Junie Spencer, FNP  levocetirizine (XYZAL) 5 MG tablet Take 1 tablet (5 mg total) by mouth every evening. 04/14/19   Junie Spencer, FNP    Allergies Patient has no known allergies.   REVIEW OF SYSTEMS  Negative except as noted here or in the History of Present Illness.   PHYSICAL EXAMINATION   Initial Vital Signs Blood pressure 124/64, temperature 98 F (36.7 C), temperature source Oral, resp. rate 18, height 5\' 9"  (1.753 m), weight 63.5 kg, SpO2 98 %.  Examination General: Well-developed, well-nourished male in no acute distress; appearance consistent with age of record HENT: normocephalic; atraumatic Eyes: Normal appearance Neck: supple Heart: regular rate and rhythm Lungs: clear to auscultation bilaterally Abdomen: soft; nondistended; nontender Extremities: No deformity; full range of motion Neurologic: Awake, alert and oriented; motor function intact in all extremities and symmetric; no facial droop Skin: Warm and dry; superficial abrasion to dorsal left hand without signs of infection:    Psychiatric: Normal mood and affect   RESULTS  Summary of this visit's results, reviewed and interpreted by myself:   EKG Interpretation  Date/Time:    Ventricular Rate:    PR Interval:    QRS Duration:   QT Interval:    QTC Calculation:   R Axis:     Text Interpretation:        Laboratory Studies: No results found for this or any previous visit (from the past 24 hour(s)). Imaging Studies: No results found.  ED COURSE and MDM  Nursing notes, initial and subsequent vitals signs, including pulse oximetry, reviewed and interpreted by  myself.  Vitals:   08/10/20 2336  BP: 124/64  Resp: 18  Temp: 98 F (36.7 C)  TempSrc: Oral  SpO2: 98%  Weight: 63.5 kg  Height: 5\' 9"  (1.753 m)   Medications - No data to display  The wound is minimal and shallow.  It does not appear to be infected.  The nursing staff had the patient wash the wound and we will apply bacitracin ointment to help prevent infection.  PROCEDURES  Procedures   ED DIAGNOSES     ICD-10-CM   1. Abrasion of left hand, initial encounter  S60.512A   2. Other contact with dog, initial encounter  W54.8XXA        Jenisa Monty, , MD 08/11/20 [phone]

## 2021-03-05 ENCOUNTER — Telehealth: Payer: BC Managed Care – PPO | Admitting: Family

## 2021-03-05 DIAGNOSIS — K047 Periapical abscess without sinus: Secondary | ICD-10-CM

## 2021-03-05 MED ORDER — CLINDAMYCIN HCL 300 MG PO CAPS: 300.0000 mg | ORAL_CAPSULE | Freq: Four times a day (QID) | ORAL | 0 refills | Status: AC

## 2021-03-05 NOTE — Progress Notes (Signed)
E-Visit for Dental Pain  We are sorry that you are not feeling well.  Here is how we plan to help!  Based on what you have shared with me in the questionnaire, it sounds like you have Dental abscess.   Clindamycin 300mg  4 times per day for 7 days  It is imperative that you see a dentist within 10 days of this eVisit to determine the cause of the dental pain and be sure it is adequately treated  A toothache or tooth pain is caused when the nerve in the root of a tooth or surrounding a tooth is irritated. Dental (tooth) infection, decay, injury, or loss of a tooth are the most common causes of dental pain. Pain may also occur after an extraction (tooth is pulled out). Pain sometimes originates from other areas and radiates to the jaw, thus appearing to be tooth pain.Bacteria growing inside your mouth can contribute to gum disease and dental decay, both of which can cause pain. A toothache occurs from inflammation of the central portion of the tooth called pulp. The pulp contains nerve endings that are very sensitive to pain. Inflammation to the pulp or pulpitis may be caused by dental cavities, trauma, and infection.    HOME CARE:   For toothaches: . Over-the-counter pain medications such as acetaminophen or ibuprofen may be used. Take these as directed on the package while you arrange for a dental appointment. . Avoid very cold or hot foods, because they may make the pain worse. . You may get relief from biting on a cotton ball soaked in oil of cloves. You can get oil of cloves at most drug stores.  For jaw pain: .  Aspirin may be helpful for problems in the joint of the jaw in adults. . If pain happens every time you open your mouth widely, the temporomandibular joint (TMJ) may be the source of the pain. Yawning or taking a large bite of food may worsen the pain. An appointment with your doctor or dentist will help you find the cause.     GET HELP RIGHT AWAY IF:  . You have a high fever  or chills . If you have had a recent head or face injury and develop headache, light headedness, nausea, vomiting, or other symptoms that concern you after an injury to your face or mouth, you could have a more serious injury in addition to your dental injury. . A facial rash associated with a toothache: This condition may improve with medication. Contact your doctor for them to decide what is appropriate. . Any jaw pain occurring with chest pain: Although jaw pain is most commonly caused by dental disease, it is sometimes referred pain from other areas. People with heart disease, especially people who have had stents placed, people with diabetes, or those who have had heart surgery may have jaw pain as a symptom of heart attack or angina. If your jaw or tooth pain is associated with lightheadedness, sweating, or shortness of breath, you should see a doctor as soon as possible. . Trouble swallowing or excessive pain or bleeding from gums: If you have a history of a weakened immune system, diabetes, or steroid use, you may be more susceptible to infections. Infections can often be more severe and extensive or caused by unusual organisms. Dental and gum infections in people with these conditions may require more aggressive treatment. An abscess may need draining or IV antibiotics, for example.  MAKE SURE YOU    Understand these instructions.  Will watch your condition.  Will get help right away if you are not doing well or get worse.  Thank you for choosing an e-visit. Your e-visit answers were reviewed by a board certified advanced clinical practitioner to complete your personal care plan. Depending upon the condition, your plan could have included both over the counter or prescription medications. Please review your pharmacy choice. Make sure the pharmacy is open so you can pick up prescription now. If there is a problem, you may contact your provider through [employer] and have the  prescription routed to another pharmacy. Your safety is important to [place]. If you have drug allergies check your prescription carefully.  For the next 24 hours you can use MyChart to ask questions about today's visit, request a non-urgent call back, or ask for a work or school excuse. You will get an email in the next two days asking about your experience. I hope that your e-visit has been valuable and will speed your recovery.  Approximately 5 minutes was spent documenting and reviewing patient's chart.

## 2021-08-01 ENCOUNTER — Other Ambulatory Visit: Payer: Self-pay

## 2021-08-01 ENCOUNTER — Emergency Department (HOSPITAL_BASED_OUTPATIENT_CLINIC_OR_DEPARTMENT_OTHER)
Admission: EM | Admit: 2021-08-01 | Discharge: 2021-08-01 | Disposition: A | Discharge status: Home / Self Care | Payer: BC Managed Care – PPO | Attending: Emergency Medicine | Admitting: Emergency Medicine

## 2021-08-01 ENCOUNTER — Encounter (HOSPITAL_BASED_OUTPATIENT_CLINIC_OR_DEPARTMENT_OTHER): Payer: Self-pay

## 2021-08-01 DIAGNOSIS — K0889 Other specified disorders of teeth and supporting structures: Secondary | ICD-10-CM

## 2021-08-01 DIAGNOSIS — F17290 Nicotine dependence, other tobacco product, uncomplicated: Secondary | ICD-10-CM | POA: Diagnosis not present

## 2021-08-01 MED ORDER — NAPROXEN 375 MG PO TABS: 375.0000 mg | ORAL_TABLET | Freq: Two times a day (BID) | ORAL | 0 refills | Status: DC

## 2021-08-01 MED ORDER — NAPROXEN 250 MG PO TABS
375.0000 mg | ORAL_TABLET | Freq: Once | ORAL | Status: AC
  Administered 2021-08-01: 250 mg via ORAL
  Filled 2021-08-01: qty 2

## 2021-08-01 NOTE — ED Triage Notes (Signed)
Pt c/o left lower wisdom tooth pain started yesterday-states he was seen by dentist today-states he was not given any pain rx-NAD-steady gait

## 2021-08-01 NOTE — ED Provider Notes (Signed)
[facility] EMERGENCY DEPARTMENT Provider Note   CSN: [id number] Arrival date & time: 08/01/21  2121     History Chief Complaint  Patient presents with   Dental Pain    Jonathan Davidson is a 25 y.o. male.  Patient presents with complaint of left lower wisdom tooth pain. Patient was seen by a dentist today, started on antibiotic. Patient states pain not improved with tylenol.  The history is provided by the patient. No language interpreter was used.  Dental Pain Location:  Lower Lower teeth location:  17/LL 3rd molar Quality:  Aching and pulsating Severity:  Moderate Previous work-up:  Dental exam Ineffective treatments:  Acetaminophen Associated symptoms: facial pain   Associated symptoms: no difficulty swallowing, no fever and no trismus       Past Medical History:  Diagnosis Date   Allergy    Attention deficit hyperactivity disorder (ADHD) 06/14/2016   Scoliosis    Varicocele 04/18/2018   left   Vitiligo 2012    Patient Active Problem List   Diagnosis Date Noted   Localized swelling, mass or lump of neck 09/30/2018   Varicocele 04/18/2018   Lymphadenopathy 09/09/2017   Cough 09/09/2017   Cigar smoker unmotivated to quit 09/09/2017   Attention deficit hyperactivity disorder (ADHD) 06/14/2016   Body aches 05/22/2016   SOB (shortness of breath) 03/24/2015   Allergic rhinitis 03/24/2015   Vitiligo 03/24/2015   General medical examination 08/16/2011    Past Surgical History:  Procedure Laterality Date   NO PAST SURGERIES         Family History  Problem Relation Age of Onset   Hypertension Father    Diabetes Other        GM   Hypertension Other        F   Diabetes Paternal Grandmother    Coronary artery disease Neg Hx    Cancer Neg Hx     Social History   Tobacco Use   Smoking status: Every Day    Types: Cigars    Start date: 09/10/2015   Smokeless tobacco: Never  Vaping Use   Vaping Use: Never used  Substance Use Topics   Alcohol  use: Yes    Comment: occ   Drug use: Yes    Types: Marijuana    Home Medications Prior to Admission medications   Medication Sig Start Date End Date Taking? Authorizing Provider  fluticasone (FLONASE) 50 MCG/ACT nasal spray Place 2 sprays into both nostrils daily. 04/14/19   Junie Spencer, FNP  levocetirizine (XYZAL) 5 MG tablet Take 1 tablet (5 mg total) by mouth every evening. 04/14/19   Junie Spencer, FNP    Allergies    Patient has no known allergies.  Review of Systems   Review of Systems  Constitutional:  Negative for fever.  HENT:  Positive for dental problem.   All other systems reviewed and are negative.  Physical Exam Updated Vital Signs BP 127/72 (BP Location: Left Arm)   Pulse (!) 111   Temp 98.7 F (37.1 C) (Oral)   Resp 18   Ht 5\' 8"  (1.727 m)   Wt 64 kg   SpO2 100%   BMI 21.44 kg/m   Physical Exam Vitals and nursing note reviewed.  Constitutional:      Appearance: Normal appearance.  HENT:     Head: Normocephalic.     Nose: Nose normal.     Mouth/Throat:     Dentition: Dental tenderness present.   Eyes:  Conjunctiva/sclera: Conjunctivae normal.  Cardiovascular:     Rate and Rhythm: Normal rate.  Pulmonary:     Effort: Pulmonary effort is normal.  Musculoskeletal:        General: Normal range of motion.  Skin:    General: Skin is warm and dry.  Neurological:     Mental Status: He is alert and oriented to person, place, and time.  Psychiatric:        Mood and Affect: Mood normal.        Behavior: Behavior normal.    ED Results / Procedures / Treatments   Labs (all labs ordered are listed, but only abnormal results are displayed) Labs Reviewed - No data to display  EKG None  Radiology No results found.  Procedures Procedures   Medications Ordered in ED Medications  naproxen (NAPROSYN) tablet 375 mg (has no administration in time range)    ED Course  I have reviewed the triage vital signs and the nursing  notes.  Pertinent labs & imaging results that were available during my care of the patient were reviewed by me and considered in my medical decision making (see chart for details).    MDM Rules/Calculators/A&P                           Patient with dentalgia.  No abscess requiring immediate incision and drainage.  Exam not concerning for Ludwig's angina or pharyngeal abscess.  Patient has seen dentist today and started on antibiotic. Care instructions and return precautions provided. Pt safe for discharge.  Final Clinical Impression(s) / ED Diagnoses Final diagnoses:  Tooth pain    Rx / DC Orders ED Discharge Orders          Ordered    naproxen (NAPROSYN) 375 MG tablet  2 times daily        08/01/21 2248             Felicie Morn, NP 08/01/21 2250    Benjiman Core, MD 08/01/21 2342

## 2021-08-01 NOTE — Discharge Instructions (Addendum)
Follow up with your dentist as scheduled. Take naproxen as directed. You may add tylenol, 1000 mg, 6 hours after each naproxen dose. Continue with the antibiotic you were prescribed by the dentist today.

## 2021-08-02 ENCOUNTER — Telehealth: Payer: Self-pay | Admitting: Family Medicine

## 2021-08-02 NOTE — Telephone Encounter (Signed)
Pt called back and stated he would prefer for Dr. Laury Axon to contact him instead of CMA.

## 2021-08-02 NOTE — Telephone Encounter (Signed)
Pt. Called in and was seen by dentist yesterday for wisdom teeth pain and root canal issues. Dentist informed him to take tylenol. Pt was still in pain and tylenol wasn't working. Pt went to er and er assessed pt and prescribed him naproxen 375 mg. He is stating since taking the naproxen it is causing stomach pain. He wants to know how he should go about preventing the stomach pain and future issues with stomach ulcers.

## 2021-08-03 ENCOUNTER — Encounter: Payer: Self-pay | Admitting: Family Medicine

## 2021-08-03 ENCOUNTER — Telehealth (INDEPENDENT_AMBULATORY_CARE_PROVIDER_SITE_OTHER): Payer: BC Managed Care – PPO | Admitting: Family Medicine

## 2021-08-03 ENCOUNTER — Other Ambulatory Visit: Payer: Self-pay

## 2021-08-03 DIAGNOSIS — K047 Periapical abscess without sinus: Secondary | ICD-10-CM

## 2021-08-03 MED ORDER — HYDROCODONE-ACETAMINOPHEN 5-325 MG PO TABS: 1.0000 | ORAL_TABLET | Freq: Four times a day (QID) | ORAL | 0 refills | Status: AC | PRN

## 2021-08-03 NOTE — Progress Notes (Signed)
MyChart Video Visit    Virtual Visit via Video Note   This visit type was conducted due to national recommendations for restrictions regarding the COVID-19 Pandemic (e.g. social distancing) in an effort to limit this patient's exposure and mitigate transmission in our community. This patient is at least at moderate risk for complications without adequate follow up. This format is felt to be most appropriate for this patient at this time. Physical exam was limited by quality of the video and audio technology used for the visit. Jonathan Davidson was able to get the patient set up on a video visit.  Patient location: Home Patient and provider in visit Provider location: Office  I discussed the limitations of evaluation and management by telemedicine and the availability of in person appointments. The patient expressed understanding and agreed to proceed.  Visit Date: 08/03/2021  Today's healthcare provider: Donato Schultz, DO      Subjective:    Patient ID: Jonathan Davidson, male    DOB: 1995/10/26, 25 y.o.   MRN: [id number]  Chief Complaint  Patient presents with   Dental Pain    HPI Patient is in today for a video visit.  He reports that he has had tooth pain in his bottom left wisdom tooth since Monday. He went to see the dentist and was given 500 mg amoxicillin 4x a day. He started taking it 2 days ago. He was given naprosyn in the ER but that gave him stomach issues. He would like medication to manage the pain.  Past Medical History:  Diagnosis Date   Allergy    Attention deficit hyperactivity disorder (ADHD) 06/14/2016   Scoliosis    Varicocele 04/18/2018   left   Vitiligo 2012    Past Surgical History:  Procedure Laterality Date   NO PAST SURGERIES      Family History  Problem Relation Age of Onset   Hypertension Father    Diabetes Other        GM   Hypertension Other        F   Diabetes Paternal Grandmother    Coronary artery disease Neg Hx    Cancer  Neg Hx     Social History   Socioeconomic History   Marital status: Single    Spouse name: Not on file   Number of children: Not on file   Years of education: Not on file   Highest education level: Not on file  Occupational History   Not on file  Tobacco Use   Smoking status: Every Day    Types: Cigars    Start date: 09/10/2015   Smokeless tobacco: Never  Vaping Use   Vaping Use: Never used  Substance and Sexual Activity   Alcohol use: Yes    Comment: occ   Drug use: Yes    Types: Marijuana   Sexual activity: Not on file  Other Topics Concern   Not on file  Social History Narrative   Household: parents, 2 brothers and 1 sister (away in College)---Attends middle school, practice track &field, dance--   Social Determinants of Health   Financial Resource Strain: Not on file  Food Insecurity: Not on file  Transportation Needs: Not on file  Physical Activity: Not on file  Stress: Not on file  Social Connections: Not on file  Intimate Partner Violence: Not on file    Outpatient Medications Prior to Visit  Medication Sig Dispense Refill   fluticasone (FLONASE) 50 MCG/ACT nasal spray Place 2  sprays into both nostrils daily. 16 g 6   levocetirizine (XYZAL) 5 MG tablet Take 1 tablet (5 mg total) by mouth every evening. 90 tablet 2   naproxen (NAPROSYN) 375 MG tablet Take 1 tablet (375 mg total) by mouth 2 (two) times daily. 20 tablet 0   No facility-administered medications prior to visit.    No Known Allergies  Review of Systems  Constitutional:  Negative for fever and malaise/fatigue.  HENT:  Negative for congestion.        (+) tooth pain  Eyes:  Negative for blurred vision.  Respiratory:  Negative for shortness of breath.   Cardiovascular:  Negative for chest pain, palpitations and leg swelling.  Gastrointestinal:  Negative for abdominal pain, blood in stool and nausea.  Genitourinary:  Negative for dysuria and frequency.  Musculoskeletal:  Negative for falls.   Skin:  Negative for rash.  Neurological:  Negative for dizziness, loss of consciousness and headaches.  Endo/Heme/Allergies:  Negative for environmental allergies.  Psychiatric/Behavioral:  Negative for depression. The patient is not nervous/anxious.       Objective:    Physical Exam Vitals and nursing note reviewed.  Constitutional:      Appearance: Normal appearance.  HENT:     Head: Normocephalic.     Jaw: Tenderness and swelling present.  Neurological:     Mental Status: He is alert and oriented to person, place, and time.  Psychiatric:        Mood and Affect: Mood normal.    There were no vitals taken for this visit. Wt Readings from Last 3 Encounters:  08/01/21 141 lb (64 kg)  08/10/20 140 lb (63.5 kg)  04/09/19 135 lb (61.2 kg)    Diabetic Foot Exam - Simple   No data filed    Lab Results  Component Value Date   WBC 12.8 (H) 03/02/2019   HGB 15.2 03/02/2019   HCT 46.4 03/02/2019   PLT 251 03/02/2019   GLUCOSE 126 (H) 03/02/2019   CHOL 124 04/15/2016   TRIG 88.0 04/15/2016   HDL 51.60 04/15/2016   LDLCALC 55 04/15/2016   ALT 32 03/02/2019   AST 49 (H) 03/02/2019   NA 138 03/02/2019   K 3.5 03/02/2019   CL 104 03/02/2019   CREATININE 1.22 03/02/2019   BUN 17 03/02/2019   CO2 18 (L) 03/02/2019    No results found for: TSH Lab Results  Component Value Date   WBC 12.8 (H) 03/02/2019   HGB 15.2 03/02/2019   HCT 46.4 03/02/2019   MCV 82.3 03/02/2019   PLT 251 03/02/2019   Lab Results  Component Value Date   NA 138 03/02/2019   K 3.5 03/02/2019   CO2 18 (L) 03/02/2019   GLUCOSE 126 (H) 03/02/2019   BUN 17 03/02/2019   CREATININE 1.22 03/02/2019   BILITOT 1.5 (H) 03/02/2019   ALKPHOS 39 03/02/2019   AST 49 (H) 03/02/2019   ALT 32 03/02/2019   PROT 7.8 03/02/2019   ALBUMIN 5.1 (H) 03/02/2019   CALCIUM 9.8 03/02/2019   ANIONGAP 16 (H) 03/02/2019   GFR 116.40 04/15/2016   Lab Results  Component Value Date   CHOL 124 04/15/2016   Lab  Results  Component Value Date   HDL 51.60 04/15/2016   Lab Results  Component Value Date   LDLCALC 55 04/15/2016   Lab Results  Component Value Date   TRIG 88.0 04/15/2016   Lab Results  Component Value Date   CHOLHDL 2 04/15/2016  No results found for: HGBA1C     Assessment & Plan:   Problem List Items Addressed This Visit   None Visit Diagnoses     Tooth abscess    -  Primary   Relevant Medications   HYDROcodone-acetaminophen (NORCO/VICODIN) 5-325 MG tablet        Meds ordered this encounter  Medications   HYDROcodone-acetaminophen (NORCO/VICODIN) 5-325 MG tablet    Sig: Take 1 tablet by mouth every 6 (six) hours as needed for moderate pain.    Dispense:  20 tablet    Refill:  0    I discussed the assessment and treatment plan with the patient. The patient was provided an opportunity to ask questions and all were answered. The patient agreed with the plan and demonstrated an understanding of the instructions.   The patient was advised to call back or seek an in-person evaluation if the symptoms worsen or if the condition fails to improve as anticipated.  I provided 20 minutes of face-to-face time during this encounter.   I,Zite Okoli,acting as a Neurosurgeon for [employer], DO.,have documented all relevant documentation on the behalf of Donato Schultz, DO,as directed by  Donato Schultz, DO while in the presence of Donato Schultz, DO.   I, Donato Schultz, DO, have reviewed all documentation for this visit. The documentation on 08/03/21 for the exam, diagnosis, procedures, and orders are all accurate and complete.    Donato Schultz, DO [facility] [phone] (phone) [phone] (fax)  [facility]

## 2021-08-03 NOTE — Assessment & Plan Note (Signed)
abx per dentist--- he is on amoxicillin 500 mg qid Pain med sent in --- database reviewed  F/u oral surgeon

## 2021-08-03 NOTE — Telephone Encounter (Signed)
Patient stated that he was seen by his dentist [employer] cabin [employer]).  He has a wisdom tooth pushing against his other tooth.  They advised that he wait for traveling oral surgeon, which is only there once a month and to take Tylenol with Motrin.  He went back to dental office and advise that he needs something stronger and the dentist stated that they do not feel comfortable writing anything stronger.  He then went ER and they gave him naprosyn and that hurt his stomach.  Spoke with Dr. Laury Axon about situation and he has a mychart video visit with her at 37.

## 2021-08-03 NOTE — Telephone Encounter (Signed)
LM requesting call back. Per Dr. Laury Axon, pt to stop Naprosyn and take Tylenol for tooth pain and schedule OV or VV.

## 2021-11-21 ENCOUNTER — Other Ambulatory Visit: Payer: Self-pay

## 2021-11-21 ENCOUNTER — Emergency Department (HOSPITAL_BASED_OUTPATIENT_CLINIC_OR_DEPARTMENT_OTHER)
Admission: EM | Admit: 2021-11-21 | Discharge: 2021-11-21 | Disposition: A | Discharge status: Home / Self Care | Payer: BC Managed Care – PPO | Attending: Emergency Medicine | Admitting: Emergency Medicine

## 2021-11-21 DIAGNOSIS — K0889 Other specified disorders of teeth and supporting structures: Secondary | ICD-10-CM | POA: Diagnosis not present

## 2021-11-21 DIAGNOSIS — R220 Localized swelling, mass and lump, head: Secondary | ICD-10-CM | POA: Diagnosis present

## 2021-11-21 MED ORDER — HYDROMORPHONE HCL 1 MG/ML IJ SOLN
1.0000 mg | Freq: Once | INTRAMUSCULAR | Status: AC
  Administered 2021-11-21: 1 mg via INTRAMUSCULAR
  Filled 2021-11-21: qty 1

## 2021-11-21 MED ORDER — LIDOCAINE HCL (PF) 1 % IJ SOLN
30.0000 mL | Freq: Once | INTRAMUSCULAR | Status: AC
  Administered 2021-11-21: 30 mL
  Filled 2021-11-21: qty 30

## 2021-11-21 NOTE — Discharge Instructions (Addendum)
PLEASE FOLLOWUP WITH YOUR ORAL SURGEON TODAY Do not have anything to eat or drink before the procedure  You were given a shot of Dilaudid for pain He was also given a dental block with lidocaine

## 2021-11-21 NOTE — ED Notes (Signed)
MD given dental box for procedure.  Patient and father made aware of plan of care

## 2021-11-21 NOTE — ED Triage Notes (Signed)
Patient coming to ED for evaluation of dental pain.  Reports he is scheduled to have oral surgery this morning.  In severe pain and has been taking hydrocodone without relief.  Pain started two days ago and visitor reports "he has been acting like this since.  He is in a lot of pain."

## 2021-11-21 NOTE — ED Provider Notes (Signed)
[facility] EMERGENCY DEPARTMENT Provider Note   CSN: [id number] Arrival date & time: 11/21/21  0557     History  Chief Complaint  Patient presents with   Dental Pain    Jonathan Davidson is a 26 y.o. male.  The history is provided by the patient.  Dental Pain Location:  Lower Quality:  Aching Severity:  Severe Onset quality:  Sudden Timing:  Constant Progression:  Worsening Chronicity:  New Context: abscess   Relieved by:  Nothing Worsened by:  Nothing Associated symptoms: facial pain and facial swelling   Associated symptoms: no drooling and no fever   Patient presents with severe dental pain. Patient reports he has an infection in his left lower molar, and was told by the oral surgeon that "it could kill me"but he has been taking pain medicines and antibiotics.  He is scheduled for oral surgery later this morning but is unable to stand the pain.    Home Medications Prior to Admission medications   Medication Sig Start Date End Date Taking? Authorizing Provider  HYDROcodone-acetaminophen (NORCO/VICODIN) 5-325 MG tablet Take 1 tablet by mouth every 6 (six) hours as needed for moderate pain. 08/03/21   Donato Schultz, DO      Allergies    Patient has no known allergies.    Review of Systems   Review of Systems  Constitutional:  Negative for fever.  HENT:  Positive for facial swelling. Negative for drooling.    Physical Exam Updated Vital Signs BP (!) 148/89 (BP Location: Right Arm)    Pulse 82    Temp (!) 97.5 F (36.4 C) (Axillary)    Resp (!) 24    SpO2 100%  Physical Exam CONSTITUTIONAL: Well developed, anxious, screaming, standing up in the room hitting the bed HEAD: Normocephalic/atraumatic EYES: EOMI/PERRL ENMT: Mucous membranes moist, no drooling, no stridor, no trismus.  Poor dentition is noted and small gingival abscess identified.  There is mild facial swelling noted around the angle of the mandible Floor of mouth soft NECK: supple no  meningeal signs LUNGS:  no apparent distress NEURO: Pt is awake/alert, moves all extremitiesx4.  He is walking around the room EXTREMITIES: full ROM SKIN: warm, color normal PSYCH: Anxious and screaming  ED Results / Procedures / Treatments   Labs (all labs ordered are listed, but only abnormal results are displayed) Labs Reviewed - No data to display  EKG None  Radiology No results found.  Procedures Dental Block  Date/Time: 11/21/2021 6:22 AM Performed by: Zadie Rhine, MD Authorized by: Zadie Rhine, MD   Consent:    Consent obtained:  Verbal   Consent given by:  Patient   Risks discussed:  Pain and unsuccessful block   Alternatives discussed:  No treatment Universal protocol:    Patient identity confirmed:  Provided demographic data Indications:    Indications: dental abscess and dental pain   Location:    Block type:  Inferior alveolar   Laterality:  Left Procedure details:    Syringe type:  Aspirating dental syringe   Needle gauge:  25 G   Anesthetic injected:  Lidocaine 1% w/o epi   Injection procedure:  Anatomic landmarks identified, introduced needle, incremental injection, negative aspiration for blood and anatomic landmarks palpated Post-procedure details:    Outcome:  Pain improved   Procedure completion:  Tolerated    Medications Ordered in ED Medications  HYDROmorphone (DILAUDID) injection 1 mg (1 mg Intramuscular Given 11/21/21 0623)  lidocaine (PF) (XYLOCAINE) 1 % injection 30  mL (30 mLs Other Given 11/21/21 7353)    ED Course/ Medical Decision Making/ A&P                           Medical Decision Making Risk Prescription drug management.   Patient with dental abscess with plans to see oral surgery later today Patient with significant pain, yelling and screaming but appears to be improving. He does have a small gingival abscess, but there is no signs of any deep space infection, his airway is clear and there is no drooling or  trismus He has already been on antibiotics. Patient is safe for discharge        Final Clinical Impression(s) / ED Diagnoses Final diagnoses:  Pain, dental    Rx / DC Orders ED Discharge Orders     None         Zadie Rhine, MD 11/21/21 [phone]

## 2021-11-21 NOTE — ED Notes (Signed)
Patient can be heard from across department.  Screaming in room with door shut

## 2022-07-28 ENCOUNTER — Emergency Department (HOSPITAL_BASED_OUTPATIENT_CLINIC_OR_DEPARTMENT_OTHER)
Admission: EM | Admit: 2022-07-28 | Discharge: 2022-07-28 | Disposition: A | Discharge status: Home / Self Care | Payer: BC Managed Care – PPO | Attending: Emergency Medicine | Admitting: Emergency Medicine

## 2022-07-28 ENCOUNTER — Other Ambulatory Visit: Payer: Self-pay

## 2022-07-28 ENCOUNTER — Encounter (HOSPITAL_BASED_OUTPATIENT_CLINIC_OR_DEPARTMENT_OTHER): Payer: Self-pay | Admitting: Emergency Medicine

## 2022-07-28 DIAGNOSIS — S6992XA Unspecified injury of left wrist, hand and finger(s), initial encounter: Secondary | ICD-10-CM | POA: Diagnosis present

## 2022-07-28 DIAGNOSIS — W268XXA Contact with other sharp object(s), not elsewhere classified, initial encounter: Secondary | ICD-10-CM | POA: Diagnosis not present

## 2022-07-28 DIAGNOSIS — S61012A Laceration without foreign body of left thumb without damage to nail, initial encounter: Secondary | ICD-10-CM | POA: Diagnosis not present

## 2022-07-28 DIAGNOSIS — Z23 Encounter for immunization: Secondary | ICD-10-CM | POA: Diagnosis not present

## 2022-07-28 MED ORDER — LIDOCAINE HCL (PF) 1 % IJ SOLN
5.0000 mL | Freq: Once | INTRAMUSCULAR | Status: AC
  Administered 2022-07-28: 5 mL
  Filled 2022-07-28: qty 5

## 2022-07-28 MED ORDER — TETANUS-DIPHTH-ACELL PERTUSSIS 5-2.5-18.5 LF-MCG/0.5 IM SUSY
0.5000 mL | PREFILLED_SYRINGE | Freq: Once | INTRAMUSCULAR | Status: AC
  Administered 2022-07-28: 0.5 mL via INTRAMUSCULAR
  Filled 2022-07-28: qty 0.5

## 2022-07-28 NOTE — ED Notes (Signed)
Patient states he fell on a metal railing. Lacerated his thumb near the pad and first knuckle. Thinks he may need stitches.

## 2022-07-28 NOTE — ED Triage Notes (Signed)
Pt reports lac to LT thumb from a metal ramp tonight

## 2022-07-28 NOTE — Discharge Instructions (Signed)
As we discussed I placed dissolving sutures in your thumb, so you do not need to return for suture removal, however I recommend that you clean the wound at least once daily with gentle soap, and replace the bandage for at least a week.  After around 7 days you no longer need the sutures so if they are irritating you you can have them removed at that time.  Please monitor for signs of infection including worsening redness, numbness, pus draining from the affected site.

## 2022-07-28 NOTE — ED Provider Notes (Signed)
[facility] EMERGENCY DEPARTMENT Provider Note   CSN: [id number] Arrival date & time: 07/28/22  2000     History  Chief Complaint  Patient presents with   Laceration    Jonathan Davidson is a 26 y.o. male with noncontributory past medical history presents with concern for a laceration to left thumb sustained from metal ramp earlier tonight.  Patient reports last tetanus shot was greater than 10 years ago.  He reports that wound was "gushing blood" prior to arrival.  Patient denies any injuries other than to left thumb.   Laceration      Home Medications Prior to Admission medications   Medication Sig Start Date End Date Taking? Authorizing Provider  HYDROcodone-acetaminophen (NORCO/VICODIN) 5-325 MG tablet Take 1 tablet by mouth every 6 (six) hours as needed for moderate pain. 08/03/21   Donato Schultz, DO      Allergies    Patient has no known allergies.    Review of Systems   Review of Systems  All other systems reviewed and are negative.   Physical Exam Updated Vital Signs BP 134/79 (BP Location: Right Arm)   Pulse 88   Temp 98.3 F (36.8 C) (Oral)   Resp 20   Ht 5\' 9"  (1.753 m)   Wt 61.2 kg   SpO2 100%   BMI 19.94 kg/m  Physical Exam Vitals and nursing note reviewed.  Constitutional:      General: He is not in acute distress.    Appearance: Normal appearance.  HENT:     Head: Normocephalic and atraumatic.  Eyes:     General:        Right eye: No discharge.        Left eye: No discharge.  Cardiovascular:     Rate and Rhythm: Normal rate and regular rhythm.     Pulses: Normal pulses.  Pulmonary:     Effort: Pulmonary effort is normal. No respiratory distress.  Musculoskeletal:        General: No deformity.     Comments: Normal range of motion, strength of flexion, extension of the affected left thumb  Skin:    General: Skin is warm and dry.     Capillary Refill: Capillary refill takes less than 2 seconds.     Comments: Approximately  3 cm laceration noted to anterior aspect of left thumb, no involvement of the nailbed.  Good vasculature of the underlying skin surface.  No active bleeding at time my evaluation.  No evidence of foreign body through full exploration of wound.  No evidence of deep tissue or tendinous involvement.  Neurological:     Mental Status: He is alert and oriented to person, place, and time.  Psychiatric:        Mood and Affect: Mood normal.        Behavior: Behavior normal.     ED Results / Procedures / Treatments   Labs (all labs ordered are listed, but only abnormal results are displayed) Labs Reviewed - No data to display  EKG None  Radiology No results found.  Procedures . Laceration Repair  Date/Time: 07/28/2022 10:51 PM  Performed by: 09/27/2022, PA-C Authorized by: Olene Floss, PA-C   Consent:    Consent obtained:  Verbal   Consent given by:  Patient   Risks, benefits, and alternatives were discussed: yes     Risks discussed:  Infection, pain, poor cosmetic result, poor wound healing and need for additional repair   Alternatives discussed:  No treatment Universal protocol:    Procedure explained and questions answered to patient or proxy's satisfaction: yes     Patient identity confirmed:  Verbally with patient Anesthesia:    Anesthesia method:  Local infiltration   Local anesthetic:  Lidocaine 1% w/o epi Laceration details:    Location:  Finger   Finger location:  L thumb   Length (cm):  3   Depth (mm):  4 Treatment:    Area cleansed with:  Saline and Shur-Clens   Amount of cleaning:  Standard Skin repair:    Repair method:  Sutures   Suture size:  5-0   Suture material:  Chromic gut   Suture technique:  Simple interrupted   Number of sutures:  4 Approximation:    Approximation:  Close Repair type:    Repair type:  Simple Post-procedure details:    Dressing:  Antibiotic ointment and non-adherent dressing   Procedure completion:   Tolerated     Medications Ordered in ED Medications  lidocaine (PF) (XYLOCAINE) 1 % injection 5 mL (5 mLs Infiltration Given 07/28/22 2249)  Tdap (BOOSTRIX) injection 0.5 mL (0.5 mLs Intramuscular Given 07/28/22 2223)    ED Course/ Medical Decision Making/ A&P                           Medical Decision Making  This is a well-appearing 26 year old male who presents with concern for laceration to left thumb sustained just prior to arrival.  Patient reports that he sliced it on a metal ramp.  He is not up-to-date on his tetanus, we will update his tetanus at this time.  Patient is neurovascular intact the affected extremity, no evidence of deep tendon involvement, foreign body, low concern for underlying bony injury as wound is fairly shallow.  No nailbed involvement.  Wound was cleaned thoroughly, and repaired as described above.  Patient's tetanus updated.  Patient tolerated repair without difficulty.  Encouraged thorough wound cleaning, and follow-up as needed for any signs of infection.  Patient discharged in stable condition at this time, return precautions given. Final Clinical Impression(s) / ED Diagnoses Final diagnoses:  Laceration of left thumb without foreign body without damage to nail, initial encounter    Rx / DC Orders ED Discharge Orders     None         Anselmo Pickler, PA-C 07/28/22 2253    Malvin Johns, MD 07/28/22 2257

## 2022-10-22 ENCOUNTER — Encounter (HOSPITAL_BASED_OUTPATIENT_CLINIC_OR_DEPARTMENT_OTHER): Payer: Self-pay | Admitting: Emergency Medicine

## 2022-10-22 ENCOUNTER — Other Ambulatory Visit: Payer: Self-pay

## 2022-10-22 ENCOUNTER — Emergency Department (HOSPITAL_BASED_OUTPATIENT_CLINIC_OR_DEPARTMENT_OTHER)
Admission: EM | Admit: 2022-10-22 | Discharge: 2022-10-22 | Disposition: A | Discharge status: Home / Self Care | Payer: BC Managed Care – PPO | Attending: Student | Admitting: Student

## 2022-10-22 DIAGNOSIS — F410 Panic disorder [episodic paroxysmal anxiety] without agoraphobia: Secondary | ICD-10-CM | POA: Insufficient documentation

## 2022-10-22 DIAGNOSIS — R1013 Epigastric pain: Secondary | ICD-10-CM | POA: Insufficient documentation

## 2022-10-22 DIAGNOSIS — F17290 Nicotine dependence, other tobacco product, uncomplicated: Secondary | ICD-10-CM | POA: Insufficient documentation

## 2022-10-22 DIAGNOSIS — R112 Nausea with vomiting, unspecified: Secondary | ICD-10-CM | POA: Diagnosis not present

## 2022-10-22 DIAGNOSIS — R1110 Vomiting, unspecified: Secondary | ICD-10-CM | POA: Diagnosis present

## 2022-10-22 DIAGNOSIS — F1290 Cannabis use, unspecified, uncomplicated: Secondary | ICD-10-CM

## 2022-10-22 MED ORDER — LORAZEPAM 2 MG/ML IJ SOLN
1.0000 mg | Freq: Once | INTRAMUSCULAR | Status: AC
  Administered 2022-10-22: 1 mg via INTRAVENOUS
  Filled 2022-10-22: qty 1

## 2022-10-22 MED ORDER — ALUM & MAG HYDROXIDE-SIMETH 200-200-20 MG/5ML PO SUSP
30.0000 mL | Freq: Once | ORAL | Status: AC
  Administered 2022-10-22: 30 mL via ORAL
  Filled 2022-10-22: qty 30

## 2022-10-22 MED ORDER — LIDOCAINE VISCOUS HCL 2 % MT SOLN
15.0000 mL | Freq: Once | OROMUCOSAL | Status: AC
  Administered 2022-10-22: 15 mL via ORAL
  Filled 2022-10-22: qty 15

## 2022-10-22 MED ORDER — LACTATED RINGERS IV BOLUS
1000.0000 mL | Freq: Once | INTRAVENOUS | Status: AC
  Administered 2022-10-22: 1000 mL via INTRAVENOUS

## 2022-10-22 MED ORDER — ONDANSETRON HCL 4 MG/2ML IJ SOLN
4.0000 mg | Freq: Once | INTRAMUSCULAR | Status: AC
  Administered 2022-10-22: 4 mg via INTRAVENOUS
  Filled 2022-10-22: qty 2

## 2022-10-22 NOTE — ED Provider Notes (Signed)
[facility] EMERGENCY DEPARTMENT Provider Note  CSN: [id number] Arrival date & time: 10/22/22 0443  Chief Complaint(s) Emesis  HPI Jonathan Davidson is a 27 y.o. male with PMH ADHD who presents emergency department for evaluation of emesis and panic.  History obtained from patient's grandmother who states that today is the patient's birthday and he smoked some "bad weed" leading to persistent nausea and vomiting with abdominal pain.  The patient arrives with agitated and hyperventilating but is hemodynamically stable.  Denies chest pain, shortness of breath or other systemic symptoms.   Past Medical History Past Medical History:  Diagnosis Date   Allergy    Attention deficit hyperactivity disorder (ADHD) 06/14/2016   Scoliosis    Varicocele 04/18/2018   left   Vitiligo 2012   Patient Active Problem List   Diagnosis Date Noted   Tooth abscess 08/03/2021   Localized swelling, mass or lump of neck 09/30/2018   Varicocele 04/18/2018   Lymphadenopathy 09/09/2017   Cough 09/09/2017   Cigar smoker unmotivated to quit 09/09/2017   Attention deficit hyperactivity disorder (ADHD) 06/14/2016   Body aches 05/22/2016   SOB (shortness of breath) 03/24/2015   Allergic rhinitis 03/24/2015   Vitiligo 03/24/2015   General medical examination 08/16/2011   Home Medication(s) Prior to Admission medications   Medication Sig Start Date End Date Taking? Authorizing Provider  HYDROcodone-acetaminophen (NORCO/VICODIN) 5-325 MG tablet Take 1 tablet by mouth every 6 (six) hours as needed for moderate pain. 08/03/21   Ann Held, DO                                                                                                                                    Past Surgical History Past Surgical History:  Procedure Laterality Date   NO PAST SURGERIES     Family History Family History  Problem Relation Age of Onset   Hypertension Father    Diabetes Other        GM   Hypertension  Other        F   Diabetes Paternal Grandmother    Coronary artery disease Neg Hx    Cancer Neg Hx     Social History Social History   Tobacco Use   Smoking status: Every Day    Types: Cigars    Start date: 09/10/2015   Smokeless tobacco: Never  Vaping Use   Vaping Use: Never used  Substance Use Topics   Alcohol use: Not Currently    Comment: occ   Drug use: Yes    Types: Marijuana   Allergies Patient has no known allergies.  Review of Systems Review of Systems  Gastrointestinal:  Positive for abdominal pain, nausea and vomiting.  Psychiatric/Behavioral:  Positive for agitation. The patient is nervous/anxious.     Physical Exam Vital Signs  I have reviewed the triage vital signs BP 131/79 (BP Location: Right Arm)   Pulse 75  Temp 99.2 F (37.3 C) (Oral)   Resp 20   Ht 5\' 8"  (1.727 m)   Wt 61.2 kg   SpO2 100%   BMI 20.53 kg/m   Physical Exam Constitutional:      General: He is not in acute distress.    Appearance: Normal appearance.  HENT:     Head: Normocephalic and atraumatic.     Nose: No congestion or rhinorrhea.  Eyes:     General:        Right eye: No discharge.        Left eye: No discharge.     Extraocular Movements: Extraocular movements intact.     Pupils: Pupils are equal, round, and reactive to light.  Cardiovascular:     Rate and Rhythm: Normal rate and regular rhythm.     Heart sounds: No murmur heard. Pulmonary:     Effort: No respiratory distress.     Breath sounds: No wheezing or rales.  Abdominal:     General: There is no distension.     Tenderness: There is no abdominal tenderness.  Musculoskeletal:        General: Normal range of motion.     Cervical back: Normal range of motion.  Skin:    General: Skin is warm and dry.  Neurological:     General: No focal deficit present.     Mental Status: He is alert.  Psychiatric:     Comments: Anxious and agitated     ED Results and Treatments Labs (all labs ordered are listed,  but only abnormal results are displayed) Labs Reviewed - No data to display                                                                                                                        Radiology No results found.  Pertinent labs & imaging results that were available during my care of the patient were reviewed by me and considered in my medical decision making (see MDM for details).  Medications Ordered in ED Medications  LORazepam (ATIVAN) injection 1 mg (has no administration in time range)  lactated ringers bolus 1,000 mL (has no administration in time range)                                                                                                                                     Procedures Procedures  (including critical  care time)  Medical Decision Making / ED Course   This patient presents to the ED for concern of abdominal pain, vomiting, marijuana use, this involves an extensive number of treatment options, and is a complaint that carries with it a high risk of complications and morbidity.  The differential diagnosis includes marijuana use, panic attack, gastritis, intra-abdominal infection  MDM: Patient seen emergency room for evaluation of epigastric abdominal pain nausea vomiting after smoking marijuana.  Physical exam reveals an agitated and anxious patient with epigastric tenderness to palpation.  Patient received 1 mg of IV Ativan and fluid resuscitation and on reevaluation his agitation and anxiety have significant improved but he does have some mild epigastric abdominal pain.  He received a GI cocktail and symptoms completely resolved.  On second reevaluation patient is very much improved and is requesting discharge.  Patient counseled on cessation of marijuana use and was discharged with outpatient follow-up.   Additional history obtained: -Additional history obtained from grandmother -External records from outside source obtained and reviewed  including: Chart review including previous notes, labs, imaging, consultation notes     Medicines ordered and prescription drug management: Meds ordered this encounter  Medications   LORazepam (ATIVAN) injection 1 mg   lactated ringers bolus 1,000 mL    -I have reviewed the patients home medicines and have made adjustments as needed  Critical interventions none   Cardiac Monitoring: The patient was maintained on a cardiac monitor.  I personally viewed and interpreted the cardiac monitored which showed an underlying rhythm of: NSR  Social Determinants of Health:  Factors impacting patients care include: Marijuana use   Reevaluation: After the interventions noted above, I reevaluated the patient and found that they have :improved  Co morbidities that complicate the patient evaluation  Past Medical History:  Diagnosis Date   Allergy    Attention deficit hyperactivity disorder (ADHD) 06/14/2016   Scoliosis    Varicocele 04/18/2018   left   Vitiligo 2012      Dispostion: I considered admission for this patient, but at this time he does not meet inpatient criteria for admission and he is safe for discharge with outpatient follow-up     Final Clinical Impression(s) / ED Diagnoses Final diagnoses:  None     @PCDICTATION @    Teressa Lower, MD 10/22/22 [phone]

## 2022-10-22 NOTE — ED Triage Notes (Signed)
Pt smoked marijuana about 9pm and then started having abd pain with nausea and vomiting around midnight  Went to [facility] but did not wait to be seen

## 2022-10-22 NOTE — ED Notes (Signed)
Pt vomited up the GI cocktail that was given

## 2022-10-22 NOTE — ED Notes (Signed)
Written and verbal inst to pt  Verbalized an understanding  To home with grandmother

## 2023-02-25 ENCOUNTER — Emergency Department (HOSPITAL_BASED_OUTPATIENT_CLINIC_OR_DEPARTMENT_OTHER)
Admission: EM | Admit: 2023-02-25 | Discharge: 2023-02-25 | Discharge status: Against Medical Advice | Payer: BC Managed Care – PPO

## 2023-02-25 NOTE — ED Notes (Signed)
Pt came in yelling at staff and was in the parking lot on the floor refusing to get up. Brought a wheelchair to the pt and asked him to sit in it so I can help him and get him registered. Pt stated "hold off baby". This RN attempted several times to get pt into wheelchair and he kept screaming. Security came out and pt finally got into wheelchair stating "I need pain meds and an IV, I'm malnourished!". Pt brought to registration where he kept yelling intermittently and would not answer registrations questions or this RN's. Pt refused to tell this RN why he was here and he finally said "I'm malnourished! I need an IV and fluids!". Pt continued to refuse to speak and this RN. Pt got upset that this RN told him we cannot help if he does not tell us what is wrong. Pt grabbed his stuff jumped out of wheelchair and said "you know what, I don't want to be seen anymore, I'm going somewhere else". Pt left and refused to speak to staff and got in his Zenaida Niece and sped off. Security, this RN, and registration all witnessed him leave without being seen.

## 2023-02-27 ENCOUNTER — Other Ambulatory Visit: Payer: Self-pay

## 2023-02-27 ENCOUNTER — Encounter (HOSPITAL_BASED_OUTPATIENT_CLINIC_OR_DEPARTMENT_OTHER): Payer: Self-pay

## 2023-02-27 ENCOUNTER — Emergency Department (HOSPITAL_BASED_OUTPATIENT_CLINIC_OR_DEPARTMENT_OTHER)
Admission: EM | Admit: 2023-02-27 | Discharge: 2023-02-27 | Disposition: A | Discharge status: Home / Self Care | Payer: BC Managed Care – PPO | Attending: Emergency Medicine | Admitting: Emergency Medicine

## 2023-02-27 DIAGNOSIS — F12188 Cannabis abuse with other cannabis-induced disorder: Secondary | ICD-10-CM | POA: Diagnosis not present

## 2023-02-27 DIAGNOSIS — R1084 Generalized abdominal pain: Secondary | ICD-10-CM | POA: Diagnosis present

## 2023-02-27 LAB — COMPREHENSIVE METABOLIC PANEL
ALT: 27 U/L (ref 0–44)
AST: 38 U/L (ref 15–41)
Albumin: 4.2 g/dL (ref 3.5–5.0)
Alkaline Phosphatase: 30 U/L — ABNORMAL LOW (ref 38–126)
Anion gap: 9 (ref 5–15)
BUN: 14 mg/dL (ref 6–20)
CO2: 22 mmol/L (ref 22–32)
Calcium: 8.7 mg/dL — ABNORMAL LOW (ref 8.9–10.3)
Chloride: 107 mmol/L (ref 98–111)
Creatinine, Ser: 1.32 mg/dL — ABNORMAL HIGH (ref 0.61–1.24)
GFR, Estimated: >60 mL/min (ref >60)
Glucose, Bld: 115 mg/dL — ABNORMAL HIGH (ref 70–99)
Potassium: 3.6 mmol/L (ref 3.5–5.1)
Sodium: 138 mmol/L (ref 135–145)
Total Bilirubin: 0.8 mg/dL (ref 0.3–1.2)
Total Protein: 6.8 g/dL (ref 6.5–8.1)

## 2023-02-27 LAB — CBC
HCT: 41.5 % (ref 39.0–52.0)
Hemoglobin: 14.4 g/dL (ref 13.0–17.0)
MCH: 28.6 pg (ref 26.0–34.0)
MCHC: 34.7 g/dL (ref 30.0–36.0)
MCV: 82.3 fL (ref 80.0–100.0)
Platelets: 192 10*3/uL (ref 150–400)
RBC: 5.04 MIL/uL (ref 4.22–5.81)
RDW: 14.2 % (ref 11.5–15.5)
WBC: 8.8 10*3/uL (ref 4.0–10.5)
nRBC: 0 % (ref 0.0–0.2)

## 2023-02-27 MED ORDER — DROPERIDOL 2.5 MG/ML IJ SOLN
1.2500 mg | Freq: Once | INTRAMUSCULAR | Status: AC
  Administered 2023-02-27: 1.25 mg via INTRAVENOUS
  Filled 2023-02-27: qty 2

## 2023-02-27 NOTE — ED Triage Notes (Signed)
Pt complaining of generalized abdominal pain started yesterday. Vomiting tonight. Admitted to smoking marijuana yesterday.

## 2023-02-27 NOTE — ED Provider Notes (Signed)
[facility] Provider Note   CSN: [id number] Arrival date & time: 02/27/23  0454     History  Chief Complaint  Patient presents with   Abdominal Pain    Jonathan Davidson is a 27 y.o. male.  The history is provided by the patient.  Abdominal Pain Pain location:  Generalized Pain radiates to:  Does not radiate Pain severity:  Severe Onset quality:  Gradual Duration:  1 week Timing:  Constant Progression:  Unchanged Chronicity:  New Context: not alcohol use   Relieved by:  Nothing Worsened by:  Nothing Ineffective treatments:  None tried Associated symptoms: nausea and vomiting   Associated symptoms: no chest pain and no fever   Risk factors: no recent hospitalization   Patient with a h/o cannabis hyperemesis syndrome who was seen at [facility] presents with abdominal pain and nausea emesis. Went to [facility] but could not wait.       Home Medications Prior to Admission medications   Medication Sig Start Date End Date Taking? Authorizing Provider  HYDROcodone-acetaminophen (NORCO/VICODIN) 5-325 MG tablet Take 1 tablet by mouth every 6 (six) hours as needed for moderate pain. 08/03/21   Donato Schultz, DO      Allergies    Patient has no known allergies.    Review of Systems   Review of Systems  Constitutional:  Negative for fever.  HENT:  Negative for facial swelling.   Eyes:  Negative for redness.  Respiratory:  Negative for wheezing and stridor.   Cardiovascular:  Negative for chest pain.  Gastrointestinal:  Positive for abdominal pain, nausea and vomiting.  All other systems reviewed and are negative.   Physical Exam Updated Vital Signs BP 120/71   Pulse 87   Temp 98.5 F (36.9 C) (Oral)   Resp 17   Ht 5\' 8"  (1.727 m)   Wt 61.2 kg   SpO2 100%   BMI 20.53 kg/m  Physical Exam Vitals and nursing note reviewed. Exam conducted with a chaperone present.  Constitutional:      General: He is not in acute distress.     Appearance: Normal appearance. He is well-developed. He is not diaphoretic.  HENT:     Head: Normocephalic and atraumatic.     Nose: Nose normal.  Eyes:     Conjunctiva/sclera: Conjunctivae normal.     Pupils: Pupils are equal, round, and reactive to light.  Cardiovascular:     Rate and Rhythm: Normal rate and regular rhythm.     Pulses: Normal pulses.     Heart sounds: Normal heart sounds.  Pulmonary:     Effort: Pulmonary effort is normal.     Breath sounds: Normal breath sounds. No wheezing or rales.  Abdominal:     General: Bowel sounds are normal.     Palpations: Abdomen is soft.     Tenderness: There is no abdominal tenderness. There is no guarding or rebound.  Musculoskeletal:        General: Normal range of motion.     Cervical back: Normal range of motion and neck supple.  Skin:    General: Skin is warm and dry.     Capillary Refill: Capillary refill takes less than 2 seconds.  Neurological:     General: No focal deficit present.     Mental Status: He is alert and oriented to person, place, and time.     Deep Tendon Reflexes: Reflexes normal.  Psychiatric:     Comments: Yelling  out and rolling around in bed      ED Results / Procedures / Treatments   Labs (all labs ordered are listed, but only abnormal results are displayed) Results for orders placed or performed during the hospital encounter of 02/27/23  Comprehensive metabolic panel  Result Value Ref Range   Sodium 138 135 - 145 mmol/L   Potassium 3.6 3.5 - 5.1 mmol/L   Chloride 107 98 - 111 mmol/L   CO2 22 22 - 32 mmol/L   Glucose, Bld 115 (H) 70 - 99 mg/dL   BUN 14 6 - 20 mg/dL   Creatinine, Ser 8.11 (H) 0.61 - 1.24 mg/dL   Calcium 8.7 (L) 8.9 - 10.3 mg/dL   Total Protein 6.8 6.5 - 8.1 g/dL   Albumin 4.2 3.5 - 5.0 g/dL   AST 38 15 - 41 U/L   ALT 27 0 - 44 U/L   Alkaline Phosphatase 30 (L) 38 - 126 U/L   Total Bilirubin 0.8 0.3 - 1.2 mg/dL   GFR, Estimated >91 >47 mL/min   Anion gap 9 5 - 15  CBC   Result Value Ref Range   WBC 8.8 4.0 - 10.5 K/uL   RBC 5.04 4.22 - 5.81 MIL/uL   Hemoglobin 14.4 13.0 - 17.0 g/dL   HCT 82.9 56.2 - 13.0 %   MCV 82.3 80.0 - 100.0 fL   MCH 28.6 26.0 - 34.0 pg   MCHC 34.7 30.0 - 36.0 g/dL   RDW 86.5 78.4 - 69.6 %   Platelets 192 150 - 400 K/uL   nRBC 0.0 0.0 - 0.2 %   No results found.  Radiology No results found.  Procedures Procedures    Medications Ordered in ED Medications  droperidol (INAPSINE) 2.5 MG/ML injection 1.25 mg (1.25 mg Intravenous Given 02/27/23 0600)    ED Course/ Medical Decision Making/ A&P                             Medical Decision Making Patient with nausea vomiting and pain  Amount and/or Complexity of Data Reviewed Independent Historian: parent    Details: See above  External Data Reviewed: labs and notes.    Details: Notes and labs from [facility] reviewed  Labs: ordered.    Details: Normal white count 8.8 normal hemoglobin 14.4, platelet count normal.  Normal sodium 138, normal potassium 3.6 creatinine 1.32 unchanged from 5/7  Risk Prescription drug management. Risk Details: Very well appearing. No tachycardiac.  No white count to suggest prolonged emesis of systemic infection.  Symptoms resolved with droperidol.  With nurse present I had a long discussion about cannabis cessation as this is the cause of his repeated abdominal pain and vomiting.  He is sleeping post medication and PO challenged.  Stable for d/c    Final Clinical Impression(s) / ED Diagnoses Final diagnoses:  Cannabis hyperemesis syndrome concurrent with and due to cannabis abuse (HCC)    Return for intractable cough, coughing up blood, fevers > 100.4 unrelieved by medication, shortness of breath, intractable vomiting, chest pain, shortness of breath, weakness, numbness, changes in speech, facial asymmetry, abdominal pain, passing out, Inability to tolerate liquids or food, cough, altered mental status or any concerns. No signs of systemic  illness or infection. The patient is nontoxic-appearing on exam and vital signs are within normal limits.  I have reviewed the triage vital signs and the nursing notes. Pertinent labs & imaging results that were available during my  care of the patient were reviewed by me and considered in my medical decision making (see chart for details). After history, exam, and medical workup I feel the patient has been appropriately medically screened and is safe for discharge home. Pertinent diagnoses were discussed with the patient. Patient was given return precautions Rx / DC Orders ED Discharge Orders     None         Patrycja Mumpower, MD 02/27/23 [phone]

## 2023-02-27 NOTE — ED Notes (Signed)
Patient able to tolerate PO water and crackers without issue. Dr. Nicanor Alcon made aware.

## 2023-07-28 ENCOUNTER — Other Ambulatory Visit: Payer: Self-pay

## 2023-07-28 ENCOUNTER — Emergency Department (HOSPITAL_BASED_OUTPATIENT_CLINIC_OR_DEPARTMENT_OTHER)
Admission: EM | Admit: 2023-07-28 | Discharge: 2023-07-28 | Disposition: A | Discharge status: Home / Self Care | Payer: BC Managed Care – PPO | Attending: Emergency Medicine | Admitting: Emergency Medicine

## 2023-07-28 ENCOUNTER — Other Ambulatory Visit (HOSPITAL_BASED_OUTPATIENT_CLINIC_OR_DEPARTMENT_OTHER): Payer: Self-pay

## 2023-07-28 DIAGNOSIS — D72829 Elevated white blood cell count, unspecified: Secondary | ICD-10-CM | POA: Insufficient documentation

## 2023-07-28 DIAGNOSIS — E876 Hypokalemia: Secondary | ICD-10-CM | POA: Insufficient documentation

## 2023-07-28 DIAGNOSIS — F1290 Cannabis use, unspecified, uncomplicated: Secondary | ICD-10-CM | POA: Insufficient documentation

## 2023-07-28 DIAGNOSIS — R112 Nausea with vomiting, unspecified: Secondary | ICD-10-CM | POA: Diagnosis present

## 2023-07-28 DIAGNOSIS — R1084 Generalized abdominal pain: Secondary | ICD-10-CM | POA: Diagnosis not present

## 2023-07-28 LAB — CBC WITH DIFFERENTIAL/PLATELET
Abs Immature Granulocytes: 0.04 10*3/uL (ref 0.00–0.07)
Basophils Absolute: 0 10*3/uL (ref 0.0–0.1)
Basophils Relative: 0 %
Eosinophils Absolute: 0 10*3/uL (ref 0.0–0.5)
Eosinophils Relative: 0 %
HCT: 45.4 % (ref 39.0–52.0)
Hemoglobin: 15.4 g/dL (ref 13.0–17.0)
Immature Granulocytes: 0 %
Lymphocytes Relative: 19 %
Lymphs Abs: 2.2 10*3/uL (ref 0.7–4.0)
MCH: 28.5 pg (ref 26.0–34.0)
MCHC: 33.9 g/dL (ref 30.0–36.0)
MCV: 83.9 fL (ref 80.0–100.0)
Monocytes Absolute: 0.8 10*3/uL (ref 0.1–1.0)
Monocytes Relative: 7 %
Neutro Abs: 8.3 10*3/uL — ABNORMAL HIGH (ref 1.7–7.7)
Neutrophils Relative %: 74 %
Platelets: 210 10*3/uL (ref 150–400)
RBC: 5.41 MIL/uL (ref 4.22–5.81)
RDW: 14.1 % (ref 11.5–15.5)
WBC: 11.4 10*3/uL — ABNORMAL HIGH (ref 4.0–10.5)
nRBC: 0 % (ref 0.0–0.2)

## 2023-07-28 LAB — LIPASE, BLOOD: Lipase: 40 U/L (ref 11–51)

## 2023-07-28 LAB — COMPREHENSIVE METABOLIC PANEL
ALT: 29 U/L (ref 0–44)
AST: 36 U/L (ref 15–41)
Albumin: 4.6 g/dL (ref 3.5–5.0)
Alkaline Phosphatase: 40 U/L (ref 38–126)
Anion gap: 14 (ref 5–15)
BUN: 12 mg/dL (ref 6–20)
CO2: 22 mmol/L (ref 22–32)
Calcium: 9.5 mg/dL (ref 8.9–10.3)
Chloride: 103 mmol/L (ref 98–111)
Creatinine, Ser: 1.32 mg/dL — ABNORMAL HIGH (ref 0.61–1.24)
GFR, Estimated: >60 mL/min (ref >60)
Glucose, Bld: 119 mg/dL — ABNORMAL HIGH (ref 70–99)
Potassium: 3.2 mmol/L — ABNORMAL LOW (ref 3.5–5.1)
Sodium: 139 mmol/L (ref 135–145)
Total Bilirubin: 1 mg/dL (ref 0.3–1.2)
Total Protein: 7.8 g/dL (ref 6.5–8.1)

## 2023-07-28 MED ORDER — ONDANSETRON HCL 4 MG PO TABS: 4.0000 mg | ORAL_TABLET | Freq: Four times a day (QID) | ORAL | 0 refills | Status: DC

## 2023-07-28 MED ORDER — FAMOTIDINE IN NACL 20-0.9 MG/50ML-% IV SOLN
20.0000 mg | Freq: Once | INTRAVENOUS | Status: AC
  Administered 2023-07-28: 20 mg via INTRAVENOUS
  Filled 2023-07-28: qty 50

## 2023-07-28 MED ORDER — POTASSIUM CHLORIDE CRYS ER 20 MEQ PO TBCR
20.0000 meq | EXTENDED_RELEASE_TABLET | Freq: Two times a day (BID) | ORAL | 0 refills | Status: AC
  Filled 2023-07-28: qty 6, 3d supply, fill #0

## 2023-07-28 MED ORDER — ONDANSETRON HCL 4 MG PO TABS
4.0000 mg | ORAL_TABLET | Freq: Four times a day (QID) | ORAL | 0 refills | Status: AC
  Filled 2023-07-28: qty 12, 3d supply, fill #0

## 2023-07-28 MED ORDER — SODIUM CHLORIDE 0.9 % IV BOLUS
1000.0000 mL | Freq: Once | INTRAVENOUS | Status: AC
  Administered 2023-07-28: 1000 mL via INTRAVENOUS

## 2023-07-28 MED ORDER — LORAZEPAM 2 MG/ML IJ SOLN
1.0000 mg | Freq: Once | INTRAMUSCULAR | Status: AC
  Administered 2023-07-28: 1 mg via INTRAVENOUS
  Filled 2023-07-28: qty 1

## 2023-07-28 MED ORDER — DROPERIDOL 2.5 MG/ML IJ SOLN
1.2500 mg | Freq: Once | INTRAMUSCULAR | Status: AC
  Administered 2023-07-28: 1.25 mg via INTRAVENOUS

## 2023-07-28 MED ORDER — ONDANSETRON HCL 4 MG/2ML IJ SOLN
4.0000 mg | Freq: Once | INTRAMUSCULAR | Status: AC
  Administered 2023-07-28: 4 mg via INTRAVENOUS
  Filled 2023-07-28: qty 2

## 2023-07-28 MED ORDER — POTASSIUM CHLORIDE CRYS ER 20 MEQ PO TBCR
40.0000 meq | EXTENDED_RELEASE_TABLET | Freq: Once | ORAL | Status: AC
  Administered 2023-07-28: 40 meq via ORAL
  Filled 2023-07-28: qty 2

## 2023-07-28 NOTE — Discharge Instructions (Signed)
Please follow-up with your primary care provider the 1 I have attached you for your regards to recent ER visit.  Today your labs are reassuring but your potassium is low and we are able to replenish it.  There is your labs are reassuring and you improved medication.  I have prescribed you Zofran to help with your nausea vomiting if you begin to experience this at home.  Please avoid using marijuana use to help avoid repeat hyperemesis.  If symptoms change or worsen please return to ER.

## 2023-07-28 NOTE — ED Provider Notes (Signed)
[facility] Provider Note   CSN: [id number] Arrival date & time: 07/28/23  1013     History  Chief Complaint  Patient presents with   Abdominal Pain   Emesis    Jonathan Davidson is a 27 y.o. male history of hyperemesis cannabinoid syndrome, ADHD presented with nausea vomiting that began this morning.  Patient does note that he had 1.5 g of marijuana this morning when he normally has "an eighth."  Patient notes that he does smoke regularly the last episode being 2 days ago.  Patient dates this feels very similar to previous episodes of hyperemesis cannabinoid and wants to be treated for such.  Patient denies hematemesis.  Patient denies fevers, chest pain, shortness of breath, change sensation of smell skills, recent illness  Home Medications Prior to Admission medications   Medication Sig Start Date End Date Taking? Authorizing Provider  potassium chloride SA (KLOR-CON M) 20 MEQ tablet Take 1 tablet (20 mEq total) by mouth 2 (two) times daily for 3 days. 07/28/23 07/31/23 Yes Jonathan Davidson, Jonathan Gust, Jonathan Davidson  HYDROcodone-acetaminophen (NORCO/VICODIN) 5-325 MG tablet Take 1 tablet by mouth every 6 (six) hours as needed for moderate pain. 08/03/21   Jonathan Spates Davidson, Jonathan Davidson  ondansetron (ZOFRAN) 4 MG tablet Take 1 tablet (4 mg total) by mouth every 6 (six) hours. 07/28/23   Jonathan Corrigan, Jonathan Davidson      Allergies    Patient has no known allergies.    Review of Systems   Review of Systems  Gastrointestinal:  Positive for abdominal pain and vomiting.    Physical Exam Updated Vital Signs BP 111/62   Pulse 77   Temp 97.8 F (36.6 C) (Oral)   Resp (!) 23   SpO2 100%  Physical Exam Constitutional:      Appearance: He is diaphoretic.  HENT:     Head: Normocephalic and atraumatic.  Eyes:     Extraocular Movements: Extraocular movements intact.     Conjunctiva/sclera: Conjunctivae normal.     Pupils: Pupils are equal, round, and reactive to light.   Cardiovascular:     Rate and Rhythm: Normal rate and regular rhythm.     Pulses: Normal pulses.     Heart sounds: Normal heart sounds.  Pulmonary:     Comments: Hyperventilating Abdominal:     Palpations: Abdomen is soft.     Tenderness: There is abdominal tenderness (Generalized cramping when I palpated). There is no guarding or rebound.  Musculoskeletal:        General: Normal range of motion.     Cervical back: Normal range of motion. No rigidity.  Skin:    General: Skin is warm.     Capillary Refill: Capillary refill takes less than 2 seconds.  Neurological:     Mental Status: He is alert and oriented to person, place, and time.     Jonathan Davidson Results / Procedures / Treatments   Labs (all labs ordered are listed, but only abnormal results are displayed) Labs Reviewed  CBC WITH DIFFERENTIAL/PLATELET - Abnormal; Notable for the following components:      Result Value   WBC 11.4 (*)    Neutro Abs 8.3 (*)    All other components within normal limits  COMPREHENSIVE METABOLIC PANEL - Abnormal; Notable for the following components:   Potassium 3.2 (*)    Glucose, Bld 119 (*)    Creatinine, Ser 1.32 (*)    All other components within normal limits  LIPASE, BLOOD  URINALYSIS, ROUTINE W REFLEX MICROSCOPIC    EKG EKG Interpretation Date/Time:  Monday July 28 2023 10:26:27 EDT Ventricular Rate:  80 PR Interval:  134 QRS Duration:  99 QT Interval:  381 QTC Calculation: 440 Davidson Axis:   88  Text Interpretation: Sinus rhythm Atrial premature complex Confirmed by Jonathan Davidson [phone]) on 07/28/2023 10:36:16 AM  Radiology No results found.  Procedures Procedures    Medications Ordered in Jonathan Davidson Medications  ondansetron (ZOFRAN) injection 4 mg (4 mg Intravenous Given 07/28/23 1028)  sodium chloride 0.9 % bolus 1,000 mL (0 mLs Intravenous Stopped 07/28/23 1140)  LORazepam (ATIVAN) injection 1 mg (1 mg Intravenous Given 07/28/23 1028)  famotidine (PEPCID) IVPB 20 mg premix (0 mg  Intravenous Stopped 07/28/23 1059)  droperidol (INAPSINE) 2.5 MG/ML injection 1.25 mg (1.25 mg Intravenous Given 07/28/23 1058)  potassium chloride SA (KLOR-CON M) CR tablet 40 mEq (40 mEq Oral Given 07/28/23 1238)    Jonathan Davidson Course/ Medical Decision Making/ A&P                                 Medical Decision Making Amount and/or Complexity of Data Reviewed Labs: ordered.  Risk Prescription drug management.   Jonathan Davidson 27 y.o. presented today for nausea vomiting. Working DDx that I considered at this time includes, but not limited to, hyperemesis cannabinoid syndrome, gastroenteritis, colitis, small bowel obstruction, appendicitis, cholecystitis, hepatobiliary pathology, gastritis, PUD, ACS, aortic dissection pancreatitis, nephrolithiasis, AAA, UTI, pyelonephritis, testicular torsion.  Davidson/o DDx: gastroenteritis, colitis, small bowel obstruction, appendicitis, cholecystitis, hepatobiliary pathology, gastritis, PUD, ACS, aortic dissection pancreatitis, nephrolithiasis, AAA, UTI, pyelonephritis, testicular torsion: These are considered less likely due to history of present illness, physical exam, labs/imaging findings.  Review of prior external notes: 06/22/2039  Unique Tests and My Interpretation:  CBC with differential: Mild leukocytosis 11.4, most likely reactive to patient's emesis CMP: Hypokalemia 3.2 Lipase: Unremarkable EKG: Rate, rhythm, axis, intervals all examined and without medically relevant abnormality. ST segments without concerns for elevations  Discussion with Independent Historian:  Grandmother  Discussion of Management of Tests: None  Risk: Medium: prescription drug management  Risk Stratification Score: None  Plan: On exam patient was no acute distress however does appear diaphoretic and hyperventilating which would account for the tachypnea.  Patient notes that he had marijuana today and began immediately having symptoms afterwards suspicious of hyperemesis  cannabinoid as patient states this feels similar to previous episodes.  Abdominal labs will be ordered and patient be given Pepcid along with fluids and Zofran.  Zofran did not help patient's nausea and so droperidol was ordered as patient does not have prolonged QT.  After receiving droperidol patient stated he was nauseous but was no longer having emesis.  Patient was p.o. challenged with water first and then able to keep down potassium and wants to be discharged.  Will discharge patient with a few days with potassium pills and Zofran.  Strongly encourage patient to avoid marijuana use to avoid future hyperemesis related to his cannabis use.  Encourage patient to follow-up with primary care provider and to remain hydrated.  Patient was given return precautions. Patient stable for discharge at this time.  Patient verbalized understanding of plan.  This chart was dictated using voice recognition software.  Despite best efforts to proofread,  errors can occur which can change the documentation meaning.         Final Clinical Impression(s) / Jonathan Davidson Diagnoses Final diagnoses:  Cannabinoid  hyperemesis syndrome    Rx / DC Orders Jonathan Davidson Discharge Orders          Ordered    ondansetron (ZOFRAN) 4 MG tablet  Every 6 hours,   Status:  Discontinued        07/28/23 1304    ondansetron (ZOFRAN) 4 MG tablet  Every 6 hours        07/28/23 1305    potassium chloride SA (KLOR-CON M) 20 MEQ tablet  2 times daily        07/28/23 1305              Jonathan Corrigan, Jonathan Davidson 07/28/23 1308    Jonathan [place], Jonathan Davidson 07/28/23 1443

## 2023-07-28 NOTE — ED Triage Notes (Signed)
In for eval of abd pain and vomiting onset 1 hr ago. Denies diarrhea or constipation. Has been taking otc supplements 2 days ago. Last smoked marijuana yesterday. Anxious. Last BM this am and loose stool.

## 2023-07-28 NOTE — ED Notes (Signed)
Pt was given fluids and tolerated them. Pt also kept down 40 meq of potassium. Pt states, "I feel better and I am ready to go".

## 2023-09-27 ENCOUNTER — Other Ambulatory Visit: Payer: Self-pay

## 2023-09-27 ENCOUNTER — Emergency Department (HOSPITAL_BASED_OUTPATIENT_CLINIC_OR_DEPARTMENT_OTHER)
Admission: EM | Admit: 2023-09-27 | Discharge: 2023-09-27 | Disposition: A | Discharge status: Home / Self Care | Payer: BC Managed Care – PPO | Attending: Emergency Medicine | Admitting: Emergency Medicine

## 2023-09-27 ENCOUNTER — Encounter (HOSPITAL_BASED_OUTPATIENT_CLINIC_OR_DEPARTMENT_OTHER): Payer: Self-pay

## 2023-09-27 DIAGNOSIS — R109 Unspecified abdominal pain: Secondary | ICD-10-CM | POA: Diagnosis present

## 2023-09-27 DIAGNOSIS — F12188 Cannabis abuse with other cannabis-induced disorder: Secondary | ICD-10-CM | POA: Diagnosis not present

## 2023-09-27 DIAGNOSIS — R112 Nausea with vomiting, unspecified: Secondary | ICD-10-CM

## 2023-09-27 DIAGNOSIS — R1116 Cannabis hyperemesis syndrome: Secondary | ICD-10-CM

## 2023-09-27 MED ORDER — DROPERIDOL 2.5 MG/ML IJ SOLN
2.5000 mg | Freq: Once | INTRAMUSCULAR | Status: AC
  Administered 2023-09-27: 2.5 mg via INTRAVENOUS
  Filled 2023-09-27: qty 2

## 2023-09-27 MED ORDER — DROPERIDOL 2.5 MG/ML IJ SOLN: 2.5000 mg | Freq: Once | INTRAMUSCULAR | Status: DC

## 2023-09-27 NOTE — Discharge Instructions (Signed)
Any products that advertises marijuana or THC will cause you to have the symptoms.  Do not take them unless you want to feel like you did today.

## 2023-09-27 NOTE — ED Triage Notes (Signed)
The patient is having abd pain and vomiting for 30 minutes. Pt stated they took 1 gummy today. He denied SOB.

## 2023-09-27 NOTE — ED Notes (Signed)
Prior to medication administration, pt extremely restless, writhing in bed, screaming out and hyperventilating; now resting quietly in NAD

## 2023-09-27 NOTE — ED Provider Notes (Signed)
[name] Provider Note   CSN: [id number] Arrival date & time: 09/27/23  1305     History  Chief Complaint  Patient presents with   Emesis   Abdominal Pain    Jonathan Davidson is a 27 y.o. male.  This is a 27 year old male with history of cannabinoid hyperemesis syndrome here today for vomiting. patient reportedly ate a "delta 8 gummy "today because "it is not like regular marijuana."  Unfortunately, shortly thereafter the patient began to experience abdominal pain and vomiting.   Emesis Associated symptoms: abdominal pain   Abdominal Pain Associated symptoms: vomiting        Home Medications Prior to Admission medications   Medication Sig Start Date End Date Taking? Authorizing Provider  HYDROcodone-acetaminophen (NORCO/VICODIN) 5-325 MG tablet Take 1 tablet by mouth every 6 (six) hours as needed for moderate pain. 08/03/21   Seabron Spates R, DO  ondansetron (ZOFRAN) 4 MG tablet Take 1 tablet (4 mg total) by mouth every 6 (six) hours. 07/28/23   Netta Corrigan, PA-C  potassium chloride SA (KLOR-CON M) 20 MEQ tablet Take 1 tablet (20 mEq total) by mouth 2 (two) times daily for 3 days. 07/28/23 07/31/23  Netta Corrigan, PA-C      Allergies    Patient has no known allergies.    Review of Systems   Review of Systems  Gastrointestinal:  Positive for abdominal pain and vomiting.    Physical Exam Updated Vital Signs BP (!) 154/50   Pulse 86   Temp 98.3 F (36.8 C) (Oral)   Resp (!) 22   Ht 5\' 8"  (1.727 m)   Wt 61 kg   SpO2 100%   BMI 20.45 kg/m  Physical Exam Vitals reviewed.  Abdominal:     General: Abdomen is flat.     Palpations: Abdomen is soft.     Tenderness: There is generalized abdominal tenderness.  Neurological:     Mental Status: He is alert.     ED Results / Procedures / Treatments   Labs (all labs ordered are listed, but only abnormal results are displayed) Labs Reviewed - No data to  display  EKG None  Radiology No results found.  Procedures Procedures    Medications Ordered in ED Medications  droperidol (INAPSINE) 2.5 MG/ML injection 2.5 mg (2.5 mg Intravenous Given 09/27/23 1328)    ED Course/ Medical Decision Making/ A&P                                 Medical Decision Making 27 year old male here today for vomiting.  Differential diagnoses include cannabinoid hyperemesis syndrome, less likely acute intra-abdominal infection, less likely obstruction.  Plan-with patient's history and his exam I believe that this is hyperemesis.  Have ordered droperidol, and after patient received it, he is feeling quite a bit better.  Anticipate discharge.  I considered labs in this patient, however with his history and exam do not believe is indicated.  Considered imaging, however with patient's improvement with symptomatic management do not believe it is indicated.  Reassessment 2:30 PM-patient's symptoms have completely resolved.  Will discharge.  Risk Prescription drug management.           Final Clinical Impression(s) / ED Diagnoses Final diagnoses:  Cannabinoid hyperemesis syndrome    Rx / DC Orders ED Discharge Orders     None  Anders Simmonds T, DO 09/27/23 1433

## 2023-11-19 ENCOUNTER — Telehealth: Payer: Self-pay

## 2024-01-11 ENCOUNTER — Other Ambulatory Visit: Payer: Self-pay

## 2024-01-11 ENCOUNTER — Encounter (HOSPITAL_BASED_OUTPATIENT_CLINIC_OR_DEPARTMENT_OTHER): Payer: Self-pay | Admitting: Emergency Medicine

## 2024-01-11 ENCOUNTER — Emergency Department (HOSPITAL_BASED_OUTPATIENT_CLINIC_OR_DEPARTMENT_OTHER)
Admission: EM | Admit: 2024-01-11 | Discharge: 2024-01-11 | Disposition: A | Discharge status: Home / Self Care | Payer: Self-pay | Attending: Emergency Medicine | Admitting: Emergency Medicine

## 2024-01-11 DIAGNOSIS — F12188 Cannabis abuse with other cannabis-induced disorder: Secondary | ICD-10-CM | POA: Insufficient documentation

## 2024-01-11 DIAGNOSIS — R109 Unspecified abdominal pain: Secondary | ICD-10-CM | POA: Insufficient documentation

## 2024-01-11 DIAGNOSIS — R1110 Vomiting, unspecified: Secondary | ICD-10-CM

## 2024-01-11 DIAGNOSIS — R112 Nausea with vomiting, unspecified: Secondary | ICD-10-CM | POA: Insufficient documentation

## 2024-01-11 HISTORY — DX: Cannabis abuse, uncomplicated: F12.10

## 2024-01-11 HISTORY — DX: Cannabis hyperemesis syndrome: R11.16

## 2024-01-11 HISTORY — DX: Nausea with vomiting, unspecified: R11.2

## 2024-01-11 LAB — COMPREHENSIVE METABOLIC PANEL
ALT: 39 U/L (ref 0–44)
AST: 42 U/L — ABNORMAL HIGH (ref 15–41)
Albumin: 4.4 g/dL (ref 3.5–5.0)
Alkaline Phosphatase: 39 U/L (ref 38–126)
Anion gap: 13 (ref 5–15)
BUN: 11 mg/dL (ref 6–20)
CO2: 21 mmol/L — ABNORMAL LOW (ref 22–32)
Calcium: 9.5 mg/dL (ref 8.9–10.3)
Chloride: 105 mmol/L (ref 98–111)
Creatinine, Ser: 1.37 mg/dL — ABNORMAL HIGH (ref 0.61–1.24)
GFR, Estimated: >60 mL/min (ref >60)
Glucose, Bld: 120 mg/dL — ABNORMAL HIGH (ref 70–99)
Potassium: 3.4 mmol/L — ABNORMAL LOW (ref 3.5–5.1)
Sodium: 139 mmol/L (ref 135–145)
Total Bilirubin: 0.6 mg/dL (ref 0.0–1.2)
Total Protein: 7.9 g/dL (ref 6.5–8.1)

## 2024-01-11 LAB — CBC WITH DIFFERENTIAL/PLATELET
Abs Immature Granulocytes: 0.08 10*3/uL — ABNORMAL HIGH (ref 0.00–0.07)
Basophils Absolute: 0.1 10*3/uL (ref 0.0–0.1)
Basophils Relative: 0 %
Eosinophils Absolute: 0 10*3/uL (ref 0.0–0.5)
Eosinophils Relative: 0 %
HCT: 42.8 % (ref 39.0–52.0)
Hemoglobin: 14.8 g/dL (ref 13.0–17.0)
Immature Granulocytes: 1 %
Lymphocytes Relative: 12 %
Lymphs Abs: 1.8 10*3/uL (ref 0.7–4.0)
MCH: 28.4 pg (ref 26.0–34.0)
MCHC: 34.6 g/dL (ref 30.0–36.0)
MCV: 82.1 fL (ref 80.0–100.0)
Monocytes Absolute: 1 10*3/uL (ref 0.1–1.0)
Monocytes Relative: 7 %
Neutro Abs: 11.5 10*3/uL — ABNORMAL HIGH (ref 1.7–7.7)
Neutrophils Relative %: 80 %
Platelets: 263 10*3/uL (ref 150–400)
RBC: 5.21 MIL/uL (ref 4.22–5.81)
RDW: 14.2 % (ref 11.5–15.5)
WBC: 14.4 10*3/uL — ABNORMAL HIGH (ref 4.0–10.5)
nRBC: 0 % (ref 0.0–0.2)

## 2024-01-11 MED ORDER — ONDANSETRON 4 MG PO TBDP
4.0000 mg | ORAL_TABLET | Freq: Once | ORAL | Status: AC | PRN
  Administered 2024-01-11: 4 mg via ORAL
  Filled 2024-01-11: qty 1

## 2024-01-11 MED ORDER — PROMETHAZINE HCL 25 MG RE SUPP: 25.0000 mg | Freq: Four times a day (QID) | RECTAL | 0 refills | Status: AC | PRN

## 2024-01-11 MED ORDER — LACTATED RINGERS IV BOLUS
1000.0000 mL | Freq: Once | INTRAVENOUS | Status: AC
  Administered 2024-01-11: 1000 mL via INTRAVENOUS

## 2024-01-11 MED ORDER — ONDANSETRON 4 MG PO TBDP: 4.0000 mg | ORAL_TABLET | Freq: Three times a day (TID) | ORAL | 0 refills | Status: AC | PRN

## 2024-01-11 MED ORDER — CAPSAICIN 0.025 % EX CREA
TOPICAL_CREAM | Freq: Once | CUTANEOUS | Status: AC
  Filled 2024-01-11: qty 60

## 2024-01-11 MED ORDER — SODIUM CHLORIDE 0.9 % IV BOLUS
1000.0000 mL | Freq: Once | INTRAVENOUS | Status: AC
  Administered 2024-01-11: 1000 mL via INTRAVENOUS

## 2024-01-11 MED ORDER — DROPERIDOL 2.5 MG/ML IJ SOLN
2.5000 mg | Freq: Once | INTRAMUSCULAR | Status: AC
  Administered 2024-01-11: 2.5 mg via INTRAVENOUS
  Filled 2024-01-11: qty 2

## 2024-01-11 NOTE — ED Triage Notes (Addendum)
 C/o vomiting since 0200. Unable to tolerate PO. Pacing room, hyperventilating. Mom had stomach bug last week.  Smokes marijuana daily, hx of cannabis induced hyperemesis

## 2024-01-11 NOTE — Discharge Instructions (Addendum)
 Continue to work with your primary care doctor about helping him with his nausea and vomiting but as we discussed I recommend that you discontinue marijuana use.  I prescribed you both Zofran and Phenergan to use for any further nausea and vomiting.

## 2024-01-11 NOTE — ED Triage Notes (Signed)
 Pt returns to ED with continued vomiting, seen earlier today for same. He states he feels hungry but is unable to keep anything down, and his "jaw is doing that lockjaw thing."

## 2024-01-11 NOTE — ED Provider Notes (Signed)
 [place] [facility] Provider Note   CSN: [id number] Arrival date & time: 01/11/24  3329     History  Chief Complaint  Patient presents with   Vomiting    Jonathan Davidson is a 28 y.o. male.  Patient here with nausea vomiting feeling.  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  Started to get worse overnight.  Seems like medications in the ED if helpful and this has occurred in the past.  Family with nausea vomiting syndrome couple weeks ago but otherwise patient's been in his normal state of health.  Denies any problem going to the bathroom otherwise.  Has some abdominal cramping.  Denies any chest pain shortness of breath.  He is pretty uncomfortable at this time hard to get him to focus much on history or exam.  The history is provided by the patient and a caregiver.       Home Medications Prior to Admission medications   Medication Sig Start Date End Date Taking? Authorizing Provider  ondansetron (ZOFRAN-ODT) 4 MG disintegrating tablet Take 1 tablet (4 mg total) by mouth every 8 (eight) hours as needed. 01/11/24  Yes Weyman Bogdon, DO  promethazine (PHENERGAN) 25 MG suppository Place 1 suppository (25 mg total) rectally every 6 (six) hours as needed for nausea or vomiting. 01/11/24  Yes Ammar Moffatt, DO  HYDROcodone-acetaminophen (NORCO/VICODIN) 5-325 MG tablet Take 1 tablet by mouth every 6 (six) hours as needed for moderate pain. 08/03/21   Seabron Spates R, DO  ondansetron (ZOFRAN) 4 MG tablet Take 1 tablet (4 mg total) by mouth every 6 (six) hours. 07/28/23   Netta Corrigan, PA-C  potassium chloride SA (KLOR-CON M) 20 MEQ tablet Take 1 tablet (20 mEq total) by mouth 2 (two) times daily for 3 days. 07/28/23 07/31/23  Netta Corrigan, PA-C      Allergies    Patient has no known allergies.    Review of Systems   Review of Systems  Physical Exam Updated Vital Signs BP 108/83 (BP Location: Right Arm)   Pulse 90   Temp (!) 97 F  (36.1 C) (Temporal)   Resp (!) 28   Ht 5\' 8"  (1.727 m)   Wt 63.5 kg   SpO2 100%   BMI 21.29 kg/m  Physical Exam Vitals and nursing note reviewed.  Constitutional:      General: He is not in acute distress.    Appearance: He is well-developed. He is ill-appearing.  HENT:     Head: Normocephalic and atraumatic.     Nose: Nose normal.     Mouth/Throat:     Mouth: Mucous membranes are moist.  Eyes:     Extraocular Movements: Extraocular movements intact.     Conjunctiva/sclera: Conjunctivae normal.     Pupils: Pupils are equal, round, and reactive to light.  Cardiovascular:     Rate and Rhythm: Normal rate and regular rhythm.     Heart sounds: No murmur heard. Pulmonary:     Effort: Pulmonary effort is normal. No respiratory distress.     Breath sounds: Normal breath sounds.  Abdominal:     Palpations: Abdomen is soft.     Tenderness: There is no abdominal tenderness.  Musculoskeletal:        General: No swelling.     Cervical back: Neck supple.  Skin:    General: Skin is warm and dry.     Capillary Refill: Capillary refill takes less than 2 seconds.  Neurological:  General: No focal deficit present.     Mental Status: He is alert.  Psychiatric:        Mood and Affect: Mood normal.     ED Results / Procedures / Treatments   Labs (all labs ordered are listed, but only abnormal results are displayed) Labs Reviewed  CBC WITH DIFFERENTIAL/PLATELET - Abnormal; Notable for the following components:      Result Value   WBC 14.4 (*)    Neutro Abs 11.5 (*)    Abs Immature Granulocytes 0.08 (*)    All other components within normal limits  COMPREHENSIVE METABOLIC PANEL - Abnormal; Notable for the following components:   Potassium 3.4 (*)    CO2 21 (*)    Glucose, Bld 120 (*)    Creatinine, Ser 1.37 (*)    AST 42 (*)    All other components within normal limits    EKG None  Radiology No results found.  Procedures Procedures    Medications Ordered in  ED Medications  sodium chloride 0.9 % bolus 1,000 mL (1,000 mLs Intravenous New Bag/Given 01/11/24 0830)  droperidol (INAPSINE) 2.5 MG/ML injection 2.5 mg (2.5 mg Intravenous Given 01/11/24 0834)    ED Course/ Medical Decision Making/ A&P                                 Medical Decision Making Amount and/or Complexity of Data Reviewed Labs: ordered.  Risk Prescription drug management.   Jonathan Davidson is here with nausea vomiting abdominal cramping.  History of marijuana hyperemesis.  Normal vitals.  No fever.  He is pretty uncomfortable on exam.  Differential is likely marijuana hyperemesis viral gastroenteritis seems less likely to be intra-abdominal process including appendicitis.  Upon chart review it looks like he has done very well with droperidol in the past.  He is had EKGs over the last year that have been unremarkable with normal QTc's.  Will give him a dose of droperidol IV fluids check basic labs reevaluate.  Per my review and interpretation of labs no significant anemia electrolyte abnormality or major leukocytosis.  He is feeling much better after droperidol.  Symptoms have resolved.  Talked extensively with family member about marijuana.  She is well aware of this is likely the cause of his symptoms.  Recommend that they continue to work with primary care doctor and try to talk with him about cessation of marijuana use.  Will prescribe Zofran and Phenergan suppositories.  Patient discharged in good condition.  This chart was dictated using voice recognition software.  Despite best efforts to proofread,  errors can occur which can change the documentation meaning.         Final Clinical Impression(s) / ED Diagnoses Final diagnoses:  Hyperemesis    Rx / DC Orders ED Discharge Orders          Ordered    promethazine (PHENERGAN) 25 MG suppository  Every 6 hours PRN        01/11/24 0918    ondansetron (ZOFRAN-ODT) 4 MG disintegrating tablet  Every 8 hours PRN         01/11/24 0918              Virgina Norfolk, DO 01/11/24 [phone]

## 2024-01-11 NOTE — ED Provider Notes (Signed)
  [place] [facility] Provider Note   CSN: [id number] Arrival date & time: 01/11/24  1828     History {Add pertinent medical, surgical, social history, OB history to HPI:1} Chief Complaint  Patient presents with   Emesis    Jonathan Davidson is a 28 y.o. male.  HPI     Home Medications Prior to Admission medications   Medication Sig Start Date End Date Taking? Authorizing Provider  HYDROcodone-acetaminophen (NORCO/VICODIN) 5-325 MG tablet Take 1 tablet by mouth every 6 (six) hours as needed for moderate pain. 08/03/21   Seabron Spates R, DO  ondansetron (ZOFRAN) 4 MG tablet Take 1 tablet (4 mg total) by mouth every 6 (six) hours. 07/28/23   Netta Corrigan, PA-C  ondansetron (ZOFRAN-ODT) 4 MG disintegrating tablet Take 1 tablet (4 mg total) by mouth every 8 (eight) hours as needed. 01/11/24   Curatolo, Adam, DO  potassium chloride SA (KLOR-CON M) 20 MEQ tablet Take 1 tablet (20 mEq total) by mouth 2 (two) times daily for 3 days. 07/28/23 07/31/23  Netta Corrigan, PA-C  promethazine (PHENERGAN) 25 MG suppository Place 1 suppository (25 mg total) rectally every 6 (six) hours as needed for nausea or vomiting. 01/11/24   Virgina Norfolk, DO      Allergies    Patient has no known allergies.    Review of Systems   Review of Systems  Physical Exam Updated Vital Signs BP 118/81   Pulse 75   Temp 98.5 F (36.9 C) (Oral)   Resp 16   Ht 5\' 8"  (1.727 m)   Wt 63.5 kg   SpO2 100%   BMI 21.29 kg/m  Physical Exam  ED Results / Procedures / Treatments   Labs (all labs ordered are listed, but only abnormal results are displayed) Labs Reviewed - No data to display  EKG None  Radiology No results found.  Procedures Procedures  {Document cardiac monitor, telemetry assessment procedure when appropriate:1}  Medications Ordered in ED Medications  ondansetron (ZOFRAN-ODT) disintegrating tablet 4 mg (4 mg Oral Given 01/11/24 1842)  lactated  ringers bolus 1,000 mL (0 mLs Intravenous Stopped 01/11/24 2252)  capsaicin (ZOSTRIX) 0.025 % cream ( Topical Given 01/11/24 2038)    ED Course/ Medical Decision Making/ A&P   {   Click here for ABCD2, HEART and other calculatorsREFRESH Note before signing :1}                              Medical Decision Making Risk OTC drugs. Prescription drug management.   ***  {Document critical care time when appropriate:1} {Document review of labs and clinical decision tools ie heart score, Chads2Vasc2 etc:1}  {Document your independent review of radiology images, and any outside records:1} {Document your discussion with family members, caretakers, and with consultants:1} {Document social determinants of health affecting pt's care:1} {Document your decision making why or why not admission, treatments were needed:1} Final Clinical Impression(s) / ED Diagnoses Final diagnoses:  None    Rx / DC Orders ED Discharge Orders     None

## 2024-01-13 ENCOUNTER — Encounter (HOSPITAL_BASED_OUTPATIENT_CLINIC_OR_DEPARTMENT_OTHER): Payer: Self-pay | Admitting: *Deleted

## 2024-01-13 ENCOUNTER — Emergency Department (HOSPITAL_BASED_OUTPATIENT_CLINIC_OR_DEPARTMENT_OTHER)
Admission: EM | Admit: 2024-01-13 | Discharge: 2024-01-14 | Disposition: A | Discharge status: Home / Self Care | Payer: Self-pay | Attending: Emergency Medicine | Admitting: Emergency Medicine

## 2024-01-13 ENCOUNTER — Other Ambulatory Visit: Payer: Self-pay

## 2024-01-13 DIAGNOSIS — F12188 Cannabis abuse with other cannabis-induced disorder: Secondary | ICD-10-CM | POA: Insufficient documentation

## 2024-01-13 DIAGNOSIS — E860 Dehydration: Secondary | ICD-10-CM | POA: Insufficient documentation

## 2024-01-13 DIAGNOSIS — R112 Nausea with vomiting, unspecified: Secondary | ICD-10-CM

## 2024-01-13 DIAGNOSIS — F1290 Cannabis use, unspecified, uncomplicated: Secondary | ICD-10-CM

## 2024-01-13 LAB — CBC WITH DIFFERENTIAL/PLATELET
Abs Immature Granulocytes: 0.05 10*3/uL (ref 0.00–0.07)
Basophils Absolute: 0.1 10*3/uL (ref 0.0–0.1)
Basophils Relative: 0 %
Eosinophils Absolute: 0.1 10*3/uL (ref 0.0–0.5)
Eosinophils Relative: 0 %
HCT: 43.3 % (ref 39.0–52.0)
Hemoglobin: 14.9 g/dL (ref 13.0–17.0)
Immature Granulocytes: 0 %
Lymphocytes Relative: 23 %
Lymphs Abs: 2.8 10*3/uL (ref 0.7–4.0)
MCH: 28.7 pg (ref 26.0–34.0)
MCHC: 34.4 g/dL (ref 30.0–36.0)
MCV: 83.4 fL (ref 80.0–100.0)
Monocytes Absolute: 1 10*3/uL (ref 0.1–1.0)
Monocytes Relative: 9 %
Neutro Abs: 8.2 10*3/uL — ABNORMAL HIGH (ref 1.7–7.7)
Neutrophils Relative %: 68 %
Platelets: 237 10*3/uL (ref 150–400)
RBC: 5.19 MIL/uL (ref 4.22–5.81)
RDW: 14.5 % (ref 11.5–15.5)
WBC: 12.1 10*3/uL — ABNORMAL HIGH (ref 4.0–10.5)
nRBC: 0 % (ref 0.0–0.2)

## 2024-01-13 LAB — COMPREHENSIVE METABOLIC PANEL
ALT: 42 U/L (ref 0–44)
AST: 48 U/L — ABNORMAL HIGH (ref 15–41)
Albumin: 4.6 g/dL (ref 3.5–5.0)
Alkaline Phosphatase: 37 U/L — ABNORMAL LOW (ref 38–126)
Anion gap: 16 — ABNORMAL HIGH (ref 5–15)
BUN: 12 mg/dL (ref 6–20)
CO2: 20 mmol/L — ABNORMAL LOW (ref 22–32)
Calcium: 9.4 mg/dL (ref 8.9–10.3)
Chloride: 104 mmol/L (ref 98–111)
Creatinine, Ser: 1.32 mg/dL — ABNORMAL HIGH (ref 0.61–1.24)
GFR, Estimated: >60 mL/min (ref >60)
Glucose, Bld: 106 mg/dL — ABNORMAL HIGH (ref 70–99)
Potassium: 3.7 mmol/L (ref 3.5–5.1)
Sodium: 140 mmol/L (ref 135–145)
Total Bilirubin: 1.1 mg/dL (ref 0.0–1.2)
Total Protein: 8 g/dL (ref 6.5–8.1)

## 2024-01-13 LAB — LIPASE, BLOOD: Lipase: 34 U/L (ref 11–51)

## 2024-01-13 MED ORDER — LORAZEPAM 2 MG/ML IJ SOLN
2.0000 mg | Freq: Once | INTRAMUSCULAR | Status: AC
  Administered 2024-01-13: 2 mg via INTRAVENOUS
  Filled 2024-01-13: qty 1

## 2024-01-13 MED ORDER — SODIUM CHLORIDE 0.9 % IV SOLN
25.0000 mg | Freq: Once | INTRAVENOUS | Status: AC
  Administered 2024-01-13: 25 mg via INTRAVENOUS
  Filled 2024-01-13: qty 1

## 2024-01-13 MED ORDER — HALOPERIDOL LACTATE 5 MG/ML IJ SOLN
5.0000 mg | Freq: Once | INTRAMUSCULAR | Status: AC
  Administered 2024-01-13: 5 mg via INTRAVENOUS
  Filled 2024-01-13: qty 1

## 2024-01-13 MED ORDER — SODIUM CHLORIDE 0.9 % IV BOLUS
1000.0000 mL | Freq: Once | INTRAVENOUS | Status: AC
  Administered 2024-01-13: 1000 mL via INTRAVENOUS

## 2024-01-13 MED ORDER — DIPHENHYDRAMINE HCL 50 MG/ML IJ SOLN
25.0000 mg | Freq: Once | INTRAMUSCULAR | Status: AC
  Administered 2024-01-13: 25 mg via INTRAVENOUS
  Filled 2024-01-13: qty 1

## 2024-01-13 MED ORDER — PROMETHAZINE HCL 25 MG/ML IJ SOLN
INTRAMUSCULAR | Status: DC
Start: 2024-01-13 — End: 2024-01-14
  Filled 2024-01-13: qty 1

## 2024-01-13 NOTE — ED Notes (Signed)
 Pt. Is crying in room all over the bed and will not keep his B/P cuff on.  Pt. Will not lay still and keep his arm straight with the IV infusing.  Pt. Crying aloud and throwing his legs over the bed railing.  Pt. All over the stretcher.  RN encouraged Pt. To stay in bed and not get up. Encouraged Pt. To call for RN with call bell.

## 2024-01-13 NOTE — ED Notes (Signed)
 Pt. All over the room and came out side of the door of room #1.   Pt. Then urinated in the trash can and on the floor.  Pt. Would not use his urinal offered earlier.

## 2024-01-13 NOTE — ED Notes (Signed)
 RN into Pt. Room and spoke with Pt. About getting back into the Bed and trying to get a Urine sample.  Pt. Calm for a min.  Pt. Then begins to act out and then speaks clearly telling RN she has an ego stroke and very nasty to the RN and states he is very angry and he can speak clearly when angry.  Pt. Yelling again and acting out.  No urine given.

## 2024-01-13 NOTE — ED Notes (Signed)
 Concerns are Pt. Could be VIOLENT

## 2024-01-13 NOTE — ED Triage Notes (Signed)
 Pt arrives ambulatory via [facility] from home, he c/o nausea. Pt has been evaluated recently for the same, dx of hyperemesis. On arrival to scene, he was walking around in the yard erratically, reported ate some [place] food and vomited. En route vitals 98% RA, 130/80, hr 96

## 2024-01-13 NOTE — ED Notes (Signed)
 Pt continues to stick his finger in his throat to vomit,even when continually advised not to do so.

## 2024-01-13 NOTE — ED Notes (Signed)
 Pt has pulled all the bedding off the bed, wants to lay on the mattress with out bedding. Pulled out his IV, up and down out of the bed constantly. At one point walking out of the room, says he "just wants to see what's going on out there" Says "I don't know what I am doing, I am sleep walking". Pt mother is at the bedside. New IV established and IV fluids are infusing currently.

## 2024-01-13 NOTE — ED Triage Notes (Signed)
 Pt reports onset of nausea and vomiting that started approx 1 hour ago. Pt states he has not used any marijuana for two days. Last Zofran dose approx 35 minutes ago.

## 2024-01-13 NOTE — ED Provider Notes (Signed)
 [place] [facility] Provider Note   CSN: [id number] Arrival date & time: 01/13/24  1927     History  Chief Complaint  Patient presents with   Emesis    Jonathan Davidson is a 28 y.o. male.  Pt is a 28 yo male with pmhx significant for cannabinoid hyperemesis syndrome.  Pt has been to the ED several times for the same.  He was last here on 3/23 twice.  Tonight, he called EMS for vomiting. Pt said he has not used MJ since he was here last.  Sx started after eating spicy [place].  He did take odt zofran prior to calling EMS, but it did not help.       Home Medications Prior to Admission medications   Medication Sig Start Date End Date Taking? Authorizing Provider  HYDROcodone-acetaminophen (NORCO/VICODIN) 5-325 MG tablet Take 1 tablet by mouth every 6 (six) hours as needed for moderate pain. 08/03/21   Seabron Spates R, DO  ondansetron (ZOFRAN) 4 MG tablet Take 1 tablet (4 mg total) by mouth every 6 (six) hours. 07/28/23   Netta Corrigan, PA-C  ondansetron (ZOFRAN-ODT) 4 MG disintegrating tablet Take 1 tablet (4 mg total) by mouth every 8 (eight) hours as needed. 01/11/24   Curatolo, Adam, DO  potassium chloride SA (KLOR-CON M) 20 MEQ tablet Take 1 tablet (20 mEq total) by mouth 2 (two) times daily for 3 days. 07/28/23 07/31/23  Netta Corrigan, PA-C  promethazine (PHENERGAN) 25 MG suppository Place 1 suppository (25 mg total) rectally every 6 (six) hours as needed for nausea or vomiting. 01/11/24   Virgina Norfolk, DO      Allergies    Patient has no known allergies.    Review of Systems   Review of Systems  Gastrointestinal:  Positive for nausea and vomiting.  All other systems reviewed and are negative.   Physical Exam Updated Vital Signs BP (!) 142/73 (BP Location: Right Arm)   Pulse 93   Temp 98.2 F (36.8 C) (Temporal)   Resp (!) 24   Ht 5\' 8"  (1.727 m)   Wt 63.5 kg   SpO2 100%   BMI 21.29 kg/m  Physical Exam Vitals and  nursing note reviewed.  Constitutional:      Appearance: Normal appearance. He is ill-appearing.     Comments: Vomiting on exam  HENT:     Right Ear: External ear normal.     Left Ear: External ear normal.     Nose: Nose normal.     Mouth/Throat:     Mouth: Mucous membranes are dry.  Eyes:     Extraocular Movements: Extraocular movements intact.     Conjunctiva/sclera: Conjunctivae normal.     Pupils: Pupils are equal, round, and reactive to light.  Cardiovascular:     Rate and Rhythm: Normal rate and regular rhythm.     Pulses: Normal pulses.     Heart sounds: Normal heart sounds.  Pulmonary:     Effort: Pulmonary effort is normal.     Breath sounds: Normal breath sounds.  Abdominal:     General: Abdomen is flat. Bowel sounds are normal.     Palpations: Abdomen is soft.  Musculoskeletal:        General: Normal range of motion.     Cervical back: Normal range of motion and neck supple.  Skin:    General: Skin is warm.     Capillary Refill: Capillary refill takes less than 2 seconds.  Neurological:     General: No focal deficit present.     Mental Status: He is alert and oriented to person, place, and time.  Psychiatric:        Mood and Affect: Mood normal.        Behavior: Behavior normal.     ED Results / Procedures / Treatments   Labs (all labs ordered are listed, but only abnormal results are displayed) Labs Reviewed  CBC WITH DIFFERENTIAL/PLATELET - Abnormal; Notable for the following components:      Result Value   WBC 12.1 (*)    Neutro Abs 8.2 (*)    All other components within normal limits  COMPREHENSIVE METABOLIC PANEL - Abnormal; Notable for the following components:   CO2 20 (*)    Glucose, Bld 106 (*)    Creatinine, Ser 1.32 (*)    AST 48 (*)    Alkaline Phosphatase 37 (*)    Anion gap 16 (*)    All other components within normal limits  LIPASE, BLOOD  URINALYSIS, ROUTINE W REFLEX MICROSCOPIC  RAPID URINE DRUG SCREEN, HOSP PERFORMED     EKG None  Radiology No results found.  Procedures Procedures    Medications Ordered in ED Medications  promethazine (PHENERGAN) 25 MG/ML injection (  Not Given 01/13/24 2002)  sodium chloride 0.9 % bolus 1,000 mL (1,000 mLs Intravenous New Bag/Given 01/13/24 1959)  promethazine (PHENERGAN) 25 mg in sodium chloride 0.9 % 50 mL IVPB (0 mg Intravenous Stopped 01/13/24 2051)  diphenhydrAMINE (BENADRYL) injection 25 mg (25 mg Intravenous Given 01/13/24 1959)  LORazepam (ATIVAN) injection 2 mg (2 mg Intravenous Given 01/13/24 2104)    ED Course/ Medical Decision Making/ A&P                                 Medical Decision Making Amount and/or Complexity of Data Reviewed Labs: ordered.  Risk Prescription drug management.   This patient presents to the ED for concern of n/v, this involves an extensive number of treatment options, and is a complaint that carries with it a high risk of complications and morbidity.  The differential diagnosis includes hyperemesis, electrolyte abn, infection   Co morbidities that complicate the patient evaluation  cannabinoid hyperemesis syndrome   Additional history obtained:  Additional history obtained from epic chart review External records from outside source obtained and reviewed including EMS report   Lab Tests:  I Ordered, and personally interpreted labs.  The pertinent results include:  cbc nl other than mild elevation in 12.1, cmp nl other than cr sl elevated at 1.32 (stable), lip nl    Medicines ordered and prescription drug management:  I ordered medication including ivfs/phenergan/ativan  for sx  Reevaluation of the patient after these medicines showed that the patient improved I have reviewed the patients home medicines and have made adjustments as needed   Test Considered:  ct   Problem List / ED Course:  N/v:  likely hyperemesis.  Pt has had several presentations for the same in the past.  He is afebrile here and  wbc is improved from 2 days ago.  He has still not urinated, but additional ivfs will be given.  If he can tolerate po fluids, he can go home.  If not, he may need to stay.   Reevaluation:  After the interventions noted above, I reevaluated the patient and found that they have :improved   Social Determinants of  Health:  Lives at home   Dispostion:  Pending at shift change        Final Clinical Impression(s) / ED Diagnoses Final diagnoses:  Cannabinoid hyperemesis syndrome  Dehydration    Rx / DC Orders ED Discharge Orders     None         Jacalyn Lefevre, MD 01/13/24 2136

## 2024-01-13 NOTE — ED Provider Notes (Signed)
 Patient is still feeling poorly. He had a possible reaction to droperidol, but doesn't sound like a true allergy. Will try some haldol and put on the monitor. Care transferred to Dr. Blinda Leatherwood.   Pricilla Loveless, MD 01/13/24 2300

## 2024-01-14 ENCOUNTER — Encounter (HOSPITAL_BASED_OUTPATIENT_CLINIC_OR_DEPARTMENT_OTHER): Payer: Self-pay | Admitting: Emergency Medicine

## 2024-01-14 ENCOUNTER — Emergency Department (HOSPITAL_BASED_OUTPATIENT_CLINIC_OR_DEPARTMENT_OTHER)
Admission: EM | Admit: 2024-01-14 | Discharge: 2024-01-14 | Disposition: A | Discharge status: Home / Self Care | Payer: Self-pay | Attending: Emergency Medicine | Admitting: Emergency Medicine

## 2024-01-14 DIAGNOSIS — T887XXA Unspecified adverse effect of drug or medicament, initial encounter: Secondary | ICD-10-CM | POA: Insufficient documentation

## 2024-01-14 DIAGNOSIS — G2401 Drug induced subacute dyskinesia: Secondary | ICD-10-CM | POA: Insufficient documentation

## 2024-01-14 HISTORY — DX: Drug induced subacute dyskinesia: G24.01

## 2024-01-14 MED ORDER — DIPHENHYDRAMINE HCL 25 MG PO CAPS
50.0000 mg | ORAL_CAPSULE | Freq: Once | ORAL | Status: AC
  Administered 2024-01-14: 50 mg via ORAL
  Filled 2024-01-14: qty 2

## 2024-01-14 NOTE — ED Provider Notes (Signed)
 Emergency Department Provider Note   I have reviewed the triage vital signs and the nursing notes.   HISTORY  Chief Complaint Medication Reaction   HPI Jonathan Davidson is a 28 y.o. male with past history of tardive dyskinesia presents to the emergency department with involuntary spasm like movements of the tongue.  He received Haldol yesterday and shortly after developed the symptoms.  He is not having any trouble breathing or swallowing but the movements are painful. Has had similar in the past.    Past Medical History:  Diagnosis Date   Allergy    Attention deficit hyperactivity disorder (ADHD) 06/14/2016   Cannabinoid hyperemesis syndrome    Cannabis abuse    Scoliosis    Tardive dyskinesia    Varicocele 04/18/2018   left   Vitiligo 2012    Review of Systems  Constitutional: No fever/chills Eyes: No visual changes. ENT: No sore throat.Positive protruding tongue movements.  Cardiovascular: Denies chest pain. Respiratory: Denies shortness of breath. Gastrointestinal: No abdominal pain.   Neurological: Negative for headaches. ____________________________________________   PHYSICAL EXAM:  VITAL SIGNS: ED Triage Vitals  Encounter Vitals Group     BP 01/14/24 1910 118/65     Pulse Rate 01/14/24 1910 87     Resp 01/14/24 1910 18     Temp 01/14/24 1910 97.7 F (36.5 C)     Temp src --      SpO2 01/14/24 1910 100 %     Weight 01/14/24 1913 140 lb (63.5 kg)     Height 01/14/24 1913 5\' 8"  (1.727 m)   Constitutional: Alert and oriented. Well appearing and in no acute distress. Eyes: Conjunctivae are normal.  Head: Atraumatic. Nose: No congestion/rhinnorhea. Mouth/Throat: Mucous membranes are moist.  Oropharynx non-erythematous. Tongue is intermittently protruding but normal in size.  Neck: No stridor.   Cardiovascular: Normal rate, regular rhythm. Good peripheral circulation. Grossly normal heart sounds.   Respiratory: Normal respiratory effort.  No retractions.  Lungs CTAB. Gastrointestinal: Soft and nontender. No distention.  Musculoskeletal: No gross deformities of extremities. Neurologic:  Normal speech and language.  Skin:  Skin is warm, dry and intact. No rash noted.  ____________________________________________   PROCEDURES  Procedure(s) performed:   Procedures  None  ____________________________________________   INITIAL IMPRESSION / ASSESSMENT AND PLAN / ED COURSE  Pertinent labs & imaging results that were available during my care of the patient were reviewed by me and considered in my medical decision making (see chart for details).   This patient is Presenting for Evaluation of tongue spasms, which does require a range of treatment options, and is a complaint that involves a moderate risk of morbidity and mortality.  The Differential Diagnoses include TD, seizure, electrolyte disturbance, etc.  Critical Interventions-    Medications  diphenhydrAMINE (BENADRYL) capsule 50 mg (50 mg Oral Given 01/14/24 2038)    Reassessment after intervention:  patient wanting to leave the ED shortly after meds.   Medical Decision Making: Summary:  Patient presents emergency department with involuntary, protruding movements of the tongue after Haldol.  On my assessment, patient is very eager to leave and not wanting to stay for full evaluation.  Appears to have tardive dyskinesia of the tongue.  Patient given Benadryl but unwilling to stay for further ED monitoring.  Advised that he can continue to dose Benadryl at home by following the instructions on the box. Haldol is listed as an allergy in this health system.    Patient's presentation is most consistent with acute,  uncomplicated illness.   Disposition: discharge  ____________________________________________  FINAL CLINICAL IMPRESSION(S) / ED DIAGNOSES  Final diagnoses:  Tardive dyskinesia    Note:  This document was prepared using Dragon voice recognition software and may  include unintentional dictation errors.  Alona Bene, MD, [facility] Emergency Medicine    Taylia Berber, Arlyss Repress, MD 01/19/24 [phone]

## 2024-01-14 NOTE — Discharge Instructions (Signed)
 It appears you are suffering from tardive dyskinesia.  I am giving you a dose of Benadryl.  You may continue to take this as directed on the over-the-counter label for the next 24 hours.  Please return with any new or suddenly worsening symptoms.

## 2024-01-14 NOTE — ED Notes (Signed)
 Pt alert and oriented X 4 at the time of discharge. RR even and unlabored. No acute distress noted. Pt verbalized understanding of discharge instructions as discussed. Pt ambulatory to lobby at time of discharge.

## 2024-01-14 NOTE — ED Notes (Signed)
 Attempted to arouse pt to dc home, pt too drowsy to send home at this time VSS, EDP made aware

## 2024-01-14 NOTE — ED Triage Notes (Signed)
 Pt with involuntary movement of tongue; sts he was treated for [facility] yesterday and received Haldol

## 2024-01-14 NOTE — ED Notes (Signed)
 Pt more awake, mother at bedside dc instructions reviewed with her.  Pt still drowsy but was able to get dressed and pivot in wc with assistance.  Pt wheeled out to car and placed in car

## 2024-03-10 ENCOUNTER — Other Ambulatory Visit (HOSPITAL_COMMUNITY): Payer: Self-pay

## 2024-06-21 ENCOUNTER — Emergency Department (HOSPITAL_BASED_OUTPATIENT_CLINIC_OR_DEPARTMENT_OTHER)
Admission: EM | Admit: 2024-06-21 | Discharge: 2024-06-21 | Disposition: A | Discharge status: Home / Self Care | Payer: Self-pay

## 2024-06-21 ENCOUNTER — Other Ambulatory Visit: Payer: Self-pay

## 2024-06-21 DIAGNOSIS — K029 Dental caries, unspecified: Secondary | ICD-10-CM | POA: Insufficient documentation

## 2024-06-21 DIAGNOSIS — K0889 Other specified disorders of teeth and supporting structures: Secondary | ICD-10-CM

## 2024-06-21 MED ORDER — AMOXICILLIN-POT CLAVULANATE 875-125 MG PO TABS: 1.0000 | ORAL_TABLET | Freq: Two times a day (BID) | ORAL | 0 refills | Status: AC

## 2024-06-21 MED ORDER — NAPROXEN 500 MG PO TABS: 500.0000 mg | ORAL_TABLET | Freq: Two times a day (BID) | ORAL | 0 refills | Status: DC

## 2024-06-21 NOTE — ED Triage Notes (Signed)
 Patient reports a dental issue that is causing him head pain.

## 2024-06-21 NOTE — Discharge Instructions (Addendum)
 Please read and follow all provided instructions.  Your diagnoses today include:  1. Pain, dental    The exam and treatment you received today has been provided on an emergency basis only. This is not a substitute for complete medical or dental care.  Tests performed today include: Vital signs. See below for your results today.   Medications prescribed:  Augmentin  - antibiotic  You have been prescribed an antibiotic medicine: take the entire course of medicine even if you are feeling better. Stopping early can cause the antibiotic not to work.  Naproxen  - anti-inflammatory pain medication Do not exceed 500mg  naproxen  every 12 hours, take with food  You have been prescribed an anti-inflammatory medication or NSAID. Take with food. Take smallest effective dose for the shortest duration needed for your pain. Stop taking if you experience stomach pain or vomiting.   Take any prescribed medications only as directed.  Home care instructions:  Follow any educational materials contained in this packet.  Follow-up instructions: Please follow-up with your dentist for further evaluation of your symptoms.   Dental Assistance: See attached dental referral and/or resource guide.   Return instructions:  Please return to the Emergency Department if you experience worsening symptoms. Please return if you develop a fever, you develop more swelling in your face or neck, you have trouble breathing or swallowing food. Please return if you have any other emergent concerns.  Additional Information:  Your vital signs today were: BP 125/80 (BP Location: Left Arm)   Pulse 75   Temp (!) 97.5 F (36.4 C) (Oral)   Resp 16   Ht 5' 8 (1.727 m)   Wt 61.2 kg   SpO2 100%   BMI 20.53 kg/m  If your blood pressure (BP) was elevated above 135/85 this visit, please have this repeated by your doctor within one month. --------------

## 2024-06-21 NOTE — ED Provider Notes (Signed)
 [place] EM[facility]MENT AT [facility] Provider Note   CSN: [id number] Arrival date & time: 06/21/24  2002     Patient presents with: Dental Pain   Jonathan Davidson is a 28 y.o. male.    Patient presents to the emergency department today for evaluation of left-sided upper dental pain.  Patient reports having pain and swelling a couple of weeks ago.  He took some leftover antibiotics with improvement.  Over the past 4 or 5 days he has had recurrent pain in this area.  No current facial swelling.  Pain radiates into the side of his head.  No fevers.       Prior to Admission medications   Medication Sig Start Date End Date Taking? Authorizing Provider  amoxicillin -clavulanate (AUGMENTIN ) 875-125 MG tablet Take 1 tablet by mouth every 12 (twelve) hours. 06/21/24  Yes Ladrea Holladay, PA-C  HYDROcodone -acetaminophen  (NORCO/VICODIN) 5-325 MG tablet Take 1 tablet by mouth every 6 (six) hours as needed for moderate pain. 08/03/21   Antonio Cyndee Jamee JONELLE, DO  naproxen  (NAPROSYN ) 500 MG tablet Take 1 tablet (500 mg total) by mouth 2 (two) times daily. 06/21/24  Yes Desiderio Chew, PA-C  ondansetron  (ZOFRAN ) 4 MG tablet Take 1 tablet (4 mg total) by mouth every 6 (six) hours. 07/28/23   Victor Lynwood DASEN, PA-C  ondansetron  (ZOFRAN -ODT) 4 MG disintegrating tablet Take 1 tablet (4 mg total) by mouth every 8 (eight) hours as needed. 01/11/24   Curatolo, Adam, DO  potassium chloride  SA (KLOR-CON  M) 20 MEQ tablet Take 1 tablet (20 mEq total) by mouth 2 (two) times daily for 3 days. 07/28/23 07/31/23  Victor Lynwood DASEN, PA-C  promethazine  (PHENERGAN ) 25 MG suppository Place 1 suppository (25 mg total) rectally every 6 (six) hours as needed for nausea or vomiting. 01/11/24   Ruthe Cornet, DO    Allergies: Haldol  [haloperidol ]    Review of Systems  Updated Vital Signs BP 125/80 (BP Location: Left Arm)   Pulse 75   Temp (!) 97.5 F (36.4 C) (Oral)   Resp 16   Ht 5' 8 (1.727 m)   Wt 61.2  kg   SpO2 100%   BMI 20.53 kg/m   Physical Exam Vitals and nursing note reviewed.  Constitutional:      Appearance: He is well-developed.  HENT:     Head: Normocephalic and atraumatic.     Jaw: No trismus.     Right Ear: External ear normal.     Left Ear: External ear normal.     Nose: Nose normal.     Mouth/Throat:     Dentition: Abnormal dentition. Dental caries present. No dental abscesses.     Pharynx: Uvula midline. No uvula swelling.     Tonsils: No tonsillar abscesses.     Comments: Dental filling noted in upper molar on the left.  No abscess noted. Eyes:     Pupils: Pupils are equal, round, and reactive to light.  Neck:     Comments: No neck swelling or Lugwig's angina Musculoskeletal:     Cervical back: Normal range of motion and neck supple.  Skin:    General: Skin is warm and dry.  Neurological:     Mental Status: He is alert.  Psychiatric:        Mood and Affect: Mood normal.     (all labs ordered are listed, but only abnormal results are displayed) Labs Reviewed - No data to display  EKG: None  Radiology: No results found.  Procedures   Medications Ordered in the ED - No data to display  ED course  Patient seen and examined. History obtained directly from patient.   Labs/EKG: None ordered.  Imaging: None ordered.  Medications/Fluids: None ordered  Most recent vital signs reviewed and are as follows: BP 125/80 (BP Location: Left Arm)   Pulse 75   Temp (!) 97.5 F (36.4 C) (Oral)   Resp 16   Ht 5' 8 (1.727 m)   Wt 61.2 kg   SpO2 100%   BMI 20.53 kg/m   Initial impression: dental pain/dental infection  Plan: Discharge to home.   Prescriptions written for: Augmentin , naproxen   Other home care instructions discussed: Avoidance of chewing or other activities that makes the symptoms worse. Eat soft foods if needed and maintain good hydration.   ED return instructions discussed: Encouraged patient to return with worsening facial or  neck swelling, difficulty breathing or swallowing, fever.   Follow-up instructions discussed: Patient encouraged to follow-up with provided dental referral, resources -- or their own dentist if able.                                   Medical Decision Making Risk Prescription drug management.   Patient presents for dental pain. They do not have a fever and do not appear septic. Exam unconcerning for Ludwig's angina or other deep tissue infection in neck and I do not feel that advanced imaging is indicated at this time. Low suspicion for PTA, RPA, epiglottis based on exam.   Patient will be treated for dental infection with antibiotic. Encouraged tylenol /NSAIDs as prescribed or as directed on the packaging for pain. Encouraged follow-up with a dentist for definitive and long-term management.       Final diagnoses:  Pain, dental    ED Discharge Orders          Ordered    amoxicillin -clavulanate (AUGMENTIN ) 875-125 MG tablet  Every 12 hours        06/21/24 2028    naproxen  (NAPROSYN ) 500 MG tablet  2 times daily        06/21/24 2028               Desiderio Chew, PA-C 06/21/24 2031    Neysa Caron PARAS, DO 06/21/24 2332

## 2024-10-13 ENCOUNTER — Emergency Department (HOSPITAL_BASED_OUTPATIENT_CLINIC_OR_DEPARTMENT_OTHER)
Admission: EM | Admit: 2024-10-13 | Discharge: 2024-10-13 | Disposition: A | Discharge status: Home / Self Care | Payer: Self-pay | Attending: Emergency Medicine | Admitting: Emergency Medicine

## 2024-10-13 ENCOUNTER — Emergency Department (HOSPITAL_BASED_OUTPATIENT_CLINIC_OR_DEPARTMENT_OTHER): Payer: Self-pay

## 2024-10-13 ENCOUNTER — Other Ambulatory Visit: Payer: Self-pay

## 2024-10-13 DIAGNOSIS — F17290 Nicotine dependence, other tobacco product, uncomplicated: Secondary | ICD-10-CM | POA: Insufficient documentation

## 2024-10-13 DIAGNOSIS — Y9339 Activity, other involving climbing, rappelling and jumping off: Secondary | ICD-10-CM | POA: Insufficient documentation

## 2024-10-13 DIAGNOSIS — X501XXA Overexertion from prolonged static or awkward postures, initial encounter: Secondary | ICD-10-CM | POA: Insufficient documentation

## 2024-10-13 DIAGNOSIS — S93401A Sprain of unspecified ligament of right ankle, initial encounter: Secondary | ICD-10-CM | POA: Insufficient documentation

## 2024-10-13 MED ORDER — NAPROXEN 500 MG PO TABS: 500.0000 mg | ORAL_TABLET | Freq: Two times a day (BID) | ORAL | 0 refills | Status: AC

## 2024-10-13 NOTE — ED Provider Notes (Signed)
 " [facility] Emergency Department Provider Note MRN:  [id number]  Arrival date & time: 10/13/2024     Chief Complaint   Ankle Pain   History of Present Illness   Jonathan Davidson is a 28 y.o. year-old male with no pertinent past medical history presenting to the ED with chief complaint of ankle pain.  Patient jumped over a bush this early morning and landed awkwardly on the right ankle.  Having pain and swelling since, trouble bearing weight on this foot.  Denies head trauma, no other injuries or concerns.  Review of Systems  A thorough review of systems was obtained and all systems are negative except as noted in the HPI and PMH.   Patient's Health History    Past Medical History:  Diagnosis Date   Allergy    Attention deficit hyperactivity disorder (ADHD) 06/14/2016   Cannabinoid hyperemesis syndrome    Cannabis abuse    Scoliosis    Tardive dyskinesia    Varicocele 04/18/2018   left   Vitiligo 2012    Past Surgical History:  Procedure Laterality Date   NO PAST SURGERIES      Family History  Problem Relation Age of Onset   Hypertension Father    Diabetes Other        GM   Hypertension Other        F   Diabetes Paternal Grandmother    Coronary artery disease Neg Hx    Cancer Neg Hx     Social History   Socioeconomic History   Marital status: Single    Spouse name: Not on file   Number of children: Not on file   Years of education: Not on file   Highest education level: Not on file  Occupational History   Not on file  Tobacco Use   Smoking status: Every Day    Types: Cigars    Start date: 09/10/2015   Smokeless tobacco: Current  Vaping Use   Vaping status: Never Used  Substance and Sexual Activity   Alcohol use: Not Currently    Comment: occ   Drug use: Yes    Types: Marijuana   Sexual activity: Not on file  Other Topics Concern   Not on file  Social History Narrative   Household: parents, 2 brothers and 1 sister (away  in College)---Attends middle school, practice track &field, dance--   Social Drivers of Health   Tobacco Use: High Risk (04/13/2024)   Received from [facility]   Patient History    Smoking Tobacco Use: Every Day    Smokeless Tobacco Use: Never    Passive Exposure: Not on file  Financial Resource Strain: Not on file  Food Insecurity: Not on file  Transportation Needs: Not on file  Physical Activity: Not on file  Stress: Not on file  Social Connections: Not on file  Intimate Partner Violence: Not on file  Depression (EYV7-0): Not on file  Alcohol Screen: Not on file  Housing: Not on file  Utilities: Not on file  Health Literacy: Not on file     Physical Exam   Vitals:   10/13/24 0333  BP: 114/71  Pulse: 98  Resp: 20  Temp: 98.8 F (37.1 C)  SpO2: 98%    CONSTITUTIONAL: Well-appearing, NAD NEURO/PSYCH:  Alert and oriented x 3, no focal deficits EYES:  eyes equal and reactive ENT/NECK:  no LAD, no JVD CARDIO: Regular rate, well-perfused, normal S1 and S2 PULM:  CTAB no wheezing or  rhonchi GI/GU:  non-distended, non-tender MSK/SPINE:  No gross deformities, no edema SKIN:  no rash, atraumatic   *Additional and/or pertinent findings included in MDM below  Diagnostic and Interventional Summary    EKG Interpretation Date/Time:    Ventricular Rate:    PR Interval:    QRS Duration:    QT Interval:    QTC Calculation:   R Axis:      Text Interpretation:         Labs Reviewed - No data to display  DG Ankle Complete Right  Final Result      Medications - No data to display   Procedures  /  Critical Care Procedures  ED Course and Medical Decision Making  Initial Impression and Ddx Minimal swelling to the right ankle, no foot tenderness, moderate tenderness to the medial and lateral malleoli.  Limb is neurovascularly intact.  Sprained wrist fracture.  Seems to be an isolated injury.  Past medical/surgical history that increases complexity of ED  encounter: None  Interpretation of Diagnostics I personally reviewed the ankle x-ray and my interpretation is as follows: No fracture    Patient Reassessment and Ultimate Disposition/Management     Discharge  Patient management required discussion with the following services or consulting groups:  None  Complexity of Problems Addressed Acute complicated illness or Injury  Additional Data Reviewed and Analyzed Further history obtained from: None  Additional Factors Impacting ED Encounter Risk Prescriptions  [name]. Theadore, MD [facility] Emergency Medicine [facility] [email or web address]  Final Clinical Impressions(s) / ED Diagnoses     ICD-10-CM   1. Sprain of right ankle, unspecified ligament, initial encounter  S93.401A       ED Discharge Orders          Ordered    naproxen  (NAPROSYN ) 500 MG tablet  2 times daily        10/13/24 0439             Discharge Instructions Discussed with and Provided to Patient:    Discharge Instructions      You were evaluated in the Emergency Department and after careful evaluation, we did not find any emergent condition requiring admission or further testing in the hospital.  Your exam/testing today is overall reassuring.  Symptoms likely due to an ankle sprain.  Your x-ray did not show any broken bones.  Use the Naprosyn  anti-inflammatory twice daily as needed for pain.  Rest the ankle over the next 2 weeks.  Follow-up with orthopedics if still having a lot of discomfort after that time.  Please return to the Emergency Department if you experience any worsening of your condition.   Thank you for allowing us  to be a part of your care.      Theadore [name], MD 10/13/24 [phone]  "

## 2024-10-13 NOTE — ED Triage Notes (Signed)
 Pt states this afternoon, jumped over a bush, landed on right foot and slid, states hear a crack.

## 2024-10-13 NOTE — ED Notes (Signed)

## 2024-10-13 NOTE — Discharge Instructions (Signed)
 You were evaluated in the Emergency Department and after careful evaluation, we did not find any emergent condition requiring admission or further testing in the hospital.  Your exam/testing today is overall reassuring.  Symptoms likely due to an ankle sprain.  Your x-ray did not show any broken bones.  Use the Naprosyn  anti-inflammatory twice daily as needed for pain.  Rest the ankle over the next 2 weeks.  Follow-up with orthopedics if still having a lot of discomfort after that time.  Please return to the Emergency Department if you experience any worsening of your condition.   Thank you for allowing us  to be a part of your care.

## 2024-10-14 ENCOUNTER — Emergency Department (HOSPITAL_BASED_OUTPATIENT_CLINIC_OR_DEPARTMENT_OTHER)
Admission: EM | Admit: 2024-10-14 | Discharge: 2024-10-14 | Disposition: A | Discharge status: Home / Self Care | Payer: Self-pay | Attending: Emergency Medicine | Admitting: Emergency Medicine

## 2024-10-14 ENCOUNTER — Emergency Department (HOSPITAL_BASED_OUTPATIENT_CLINIC_OR_DEPARTMENT_OTHER): Payer: Self-pay

## 2024-10-14 ENCOUNTER — Encounter (HOSPITAL_BASED_OUTPATIENT_CLINIC_OR_DEPARTMENT_OTHER): Payer: Self-pay

## 2024-10-14 ENCOUNTER — Other Ambulatory Visit: Payer: Self-pay

## 2024-10-14 DIAGNOSIS — M25561 Pain in right knee: Secondary | ICD-10-CM | POA: Insufficient documentation

## 2024-10-14 DIAGNOSIS — M79604 Pain in right leg: Secondary | ICD-10-CM | POA: Insufficient documentation

## 2024-10-14 NOTE — Discharge Instructions (Addendum)
 Please continue to use the boot and crutches.  Ice to the affected areas.  Ibuprofen for pain.  Follow-up with orthopedics

## 2024-10-14 NOTE — ED Provider Notes (Signed)
 " [place] EM[facility]ENC[facility] AT [facility] Provider Note   CSN: [id number] Arrival date & time: 10/14/24  9266     Patient presents with: Leg Pain   Jonathan Davidson is a 28 y.o. male.  He was seen here yesterday after injury to right ankle when he jumped and twisted his ankle.  X-rays were negative and he was placed in a cam boot.  He is back today with complaint of pop in right knee and pain in knee and upper tib-fib.  Would like to get an x-ray of those areas.  No new trauma.   The history is provided by the patient.  Leg Pain Location:  Knee and leg Time since incident:  2 days Leg location:  R lower leg Knee location:  R knee Pain details:    Progression:  Waxing and waning Chronicity:  New Ineffective treatments:  None tried      Prior to Admission medications  Medication Sig Start Date End Date Taking? Authorizing Provider  amoxicillin -clavulanate (AUGMENTIN ) 875-125 MG tablet Take 1 tablet by mouth every 12 (twelve) hours. 06/21/24   Geiple, Joshua, PA-C  HYDROcodone -acetaminophen  (NORCO/VICODIN) 5-325 MG tablet Take 1 tablet by mouth every 6 (six) hours as needed for moderate pain. 08/03/21   Antonio Cyndee Rockers R, DO  naproxen  (NAPROSYN ) 500 MG tablet Take 1 tablet (500 mg total) by mouth 2 (two) times daily. 10/13/24   Theadore [name], MD  ondansetron  (ZOFRAN ) 4 MG tablet Take 1 tablet (4 mg total) by mouth every 6 (six) hours. 07/28/23   Victor Lynwood DASEN, PA-C  ondansetron  (ZOFRAN -ODT) 4 MG disintegrating tablet Take 1 tablet (4 mg total) by mouth every 8 (eight) hours as needed. 01/11/24   Curatolo, Adam, DO  potassium chloride  SA (KLOR-CON  M) 20 MEQ tablet Take 1 tablet (20 mEq total) by mouth 2 (two) times daily for 3 days. 07/28/23 07/31/23  Victor Lynwood DASEN, PA-C  promethazine  (PHENERGAN ) 25 MG suppository Place 1 suppository (25 mg total) rectally every 6 (six) hours as needed for nausea or vomiting. 01/11/24   Ruthe Cornet, DO    Allergies: Haldol   [haloperidol ]    Review of Systems  Updated Vital Signs BP 109/75 (BP Location: Left Arm)   Pulse 88   Temp 98.4 F (36.9 C) (Oral)   Resp 16   SpO2 98%   Physical Exam Vitals and nursing note reviewed.  Constitutional:      Appearance: Normal appearance. He is well-developed.  HENT:     Head: Normocephalic and atraumatic.  Eyes:     Conjunctiva/sclera: Conjunctivae normal.  Pulmonary:     Effort: Pulmonary effort is normal.  Musculoskeletal:        General: Tenderness present. No deformity. Normal range of motion.     Cervical back: Neck supple.     Comments: Right ankle in cam boot.  Diffuse tenderness around right knee right tib-fib.  No cords appreciated.  Knee ligamentous stable.  No significant swelling.  Skin:    General: Skin is warm and dry.  Neurological:     General: No focal deficit present.     Mental Status: He is alert.     GCS: GCS eye subscore is 4. GCS verbal subscore is 5. GCS motor subscore is 6.     Motor: No weakness.     (all labs ordered are listed, but only abnormal results are displayed) Labs Reviewed - No data to display  EKG: None  Radiology: DG Tibia/Fibula Right Result  Date: 10/14/2024 CLINICAL DATA:  Pain after a fall. EXAM: RIGHT TIBIA AND FIBULA - 2 VIEW COMPARISON:  None Available. FINDINGS: No acute osseous or joint abnormality. There may be a very small scratched at there may be a small exostosis along the proximal medial fibular shaft. Bipartite patella incidentally noted. IMPRESSION: No acute findings. Electronically Signed   By: Newell Eke M.D.   On: 10/14/2024 09:05   DG Knee Complete 4 Views Right Result Date: 10/14/2024 CLINICAL DATA:  Pain after a fall.  Popping sensation. EXAM: RIGHT KNEE - COMPLETE 4+ VIEW COMPARISON:  None Available. FINDINGS: No acute osseous or joint abnormality.  Bipartite patella. IMPRESSION: No acute osseous or joint abnormality. Electronically Signed   By: Newell Eke M.D.   On: 10/14/2024  09:05   DG Ankle Complete Right Result Date: 10/13/2024 EXAM: 3 OR MORE VIEW(S) XRAY OF THE RIGHT ANKLE 10/13/2024 03:45:00 AM CLINICAL HISTORY: injury injury COMPARISON: None available. FINDINGS: BONES AND JOINTS: No acute fracture. No malalignment. SOFT TISSUES: There is mild soft tissue swelling laterally and anteriorly. There is mild swelling in the hindfoot, greater laterally. IMPRESSION: 1. Soft tissue swelling without evidence of fractures. Electronically signed by: Francis Quam MD 10/13/2024 04:15 AM EST RP Workstation: [id number]     Procedures   Medications Ordered in the ED - No data to display  Clinical Course as of 10/14/24 1614  Thu Oct 14, 2024  0857 X-rays of right knee and tib-fib did not show any acute fracture.  Awaiting radiology reading. [MB]    Clinical Course User Index [MB] Towana Ozell BROCKS, MD                                 Medical Decision Making Amount and/or Complexity of Data Reviewed Radiology: ordered.   This patient complains of right leg and knee pain; this involves an extensive number of treatment Options and is a complaint that carries with it a high risk of complications and morbidity. The differential includes fracture, contusion, dislocation, strain I ordered imaging studies which included x-rays of right knee and tib-fib and I independently    visualized and interpreted imaging which showed no acute findings Previous records obtained and reviewed in epic ED visit yesterday Social determinants considered, tobacco use Critical Interventions: None  After the interventions stated above, I reevaluated the patient and found patient to be well-appearing in no distress Admission and further testing considered, no indications for admission or further workup at this time.  Given contact information for outpatient orthopedics.  Return instructions discussed      Final diagnoses:  Right leg pain  Acute pain of right knee    ED Discharge  Orders     None          Towana Ozell BROCKS, MD 10/14/24 1616  "

## 2024-10-14 NOTE — ED Triage Notes (Signed)
 Pt states he sprained his ankle yesterday.   Said he heard a pop in his knee 2 hours ago and now has pain in his lower leg.He wants to make sure they checked his lower leg during his xray yesterday.
